# Patient Record
Sex: Female | Born: 1935 | Race: White | Hispanic: No | Smoking: Former smoker
Health system: Southern US, Community
[De-identification: ages and names within clinical notes are randomized; demographics above are authoritative.]

## PROBLEM LIST (undated history)

## (undated) DIAGNOSIS — C50919 Malignant neoplasm of unspecified site of unspecified female breast: Secondary | ICD-10-CM

## (undated) DIAGNOSIS — J449 Chronic obstructive pulmonary disease, unspecified: Secondary | ICD-10-CM

## (undated) DIAGNOSIS — I739 Peripheral vascular disease, unspecified: Secondary | ICD-10-CM

## (undated) HISTORY — PX: APPENDECTOMY: SHX54

---

## 2009-10-14 ENCOUNTER — Encounter: Admission: RE | Admit: 2009-10-14 | Discharge: 2009-10-14 | Payer: Self-pay | Admitting: Neurological Surgery

## 2009-10-19 ENCOUNTER — Encounter: Admission: RE | Admit: 2009-10-19 | Discharge: 2009-10-19 | Payer: Self-pay | Admitting: Neurological Surgery

## 2009-11-06 ENCOUNTER — Ambulatory Visit (HOSPITAL_COMMUNITY): Admission: RE | Admit: 2009-11-06 | Discharge: 2009-11-06 | Payer: Self-pay | Admitting: Neurological Surgery

## 2009-11-20 ENCOUNTER — Inpatient Hospital Stay (HOSPITAL_COMMUNITY)
Admission: RE | Admit: 2009-11-20 | Discharge: 2009-11-21 | Payer: Self-pay | Source: Home / Self Care | Admitting: Neurological Surgery

## 2009-12-28 ENCOUNTER — Encounter: Admission: RE | Admit: 2009-12-28 | Discharge: 2009-12-28 | Payer: Self-pay | Admitting: Neurological Surgery

## 2010-03-08 ENCOUNTER — Encounter
Admission: RE | Admit: 2010-03-08 | Discharge: 2010-03-08 | Payer: Self-pay | Source: Home / Self Care | Attending: Neurological Surgery | Admitting: Neurological Surgery

## 2010-04-14 ENCOUNTER — Inpatient Hospital Stay: Admission: RE | Admit: 2010-04-14 | Payer: Self-pay | Source: Home / Self Care | Admitting: Neurological Surgery

## 2010-05-06 LAB — BASIC METABOLIC PANEL
CO2: 27 mEq/L (ref 19–32)
Chloride: 115 mEq/L — ABNORMAL HIGH (ref 96–112)
GFR calc Af Amer: >60 mL/min (ref >60)
Potassium: 4.8 mEq/L (ref 3.5–5.1)

## 2010-05-06 LAB — DIFFERENTIAL
Basophils Relative: 0 % (ref 0–1)
Eosinophils Absolute: 0.2 10*3/uL (ref 0.0–0.7)
Eosinophils Relative: 2 % (ref 0–5)
Lymphs Abs: 1.2 10*3/uL (ref 0.7–4.0)
Monocytes Relative: 11 % (ref 3–12)
Neutrophils Relative %: 72 % (ref 43–77)

## 2010-05-06 LAB — CBC
HCT: 38.3 % (ref 36.0–46.0)
HCT: 38.5 % (ref 36.0–46.0)
Hemoglobin: 12.2 g/dL (ref 12.0–15.0)
MCH: 33.2 pg (ref 26.0–34.0)
MCHC: 31.7 g/dL (ref 30.0–36.0)
MCV: 101.6 fL — ABNORMAL HIGH (ref 78.0–100.0)
RBC: 3.77 MIL/uL — ABNORMAL LOW (ref 3.87–5.11)
RBC: 3.9 MIL/uL (ref 3.87–5.11)
WBC: 5.3 10*3/uL (ref 4.0–10.5)
WBC: 7.9 10*3/uL (ref 4.0–10.5)

## 2010-05-06 LAB — COMPREHENSIVE METABOLIC PANEL
ALT: 21 U/L (ref 0–35)
AST: 27 U/L (ref 0–37)
Alkaline Phosphatase: 89 U/L (ref 39–117)
CO2: 26 mEq/L (ref 19–32)
Calcium: 9.6 mg/dL (ref 8.4–10.5)
Chloride: 109 mEq/L (ref 96–112)
GFR calc Af Amer: >60 mL/min (ref >60)
GFR calc non Af Amer: >60 mL/min (ref >60)
Glucose, Bld: 84 mg/dL (ref 70–99)
Potassium: 5.5 mEq/L — ABNORMAL HIGH (ref 3.5–5.1)
Sodium: 140 mEq/L (ref 135–145)

## 2010-05-06 LAB — SURGICAL PCR SCREEN: MRSA, PCR: NEGATIVE

## 2010-05-06 LAB — PROTIME-INR: Prothrombin Time: 13.3 seconds (ref 11.6–15.2)

## 2010-05-06 LAB — APTT: aPTT: 30 seconds (ref 24–37)

## 2010-05-24 ENCOUNTER — Encounter (HOSPITAL_COMMUNITY)
Admission: RE | Admit: 2010-05-24 | Discharge: 2010-05-24 | Disposition: A | Discharge status: Home / Self Care | Payer: Medicare Other | Source: Ambulatory Visit | Attending: Neurological Surgery | Admitting: Neurological Surgery

## 2010-05-24 LAB — TYPE AND SCREEN
ABO/RH(D): O POS
Antibody Screen: NEGATIVE

## 2010-05-24 LAB — DIFFERENTIAL
Lymphocytes Relative: 25 % (ref 12–46)
Lymphs Abs: 1.2 10*3/uL (ref 0.7–4.0)
Neutro Abs: 3 10*3/uL (ref 1.7–7.7)
Neutrophils Relative %: 62 % (ref 43–77)

## 2010-05-24 LAB — CBC
HCT: 39 % (ref 36.0–46.0)
Hemoglobin: 13 g/dL (ref 12.0–15.0)
MCV: 94.4 fL (ref 78.0–100.0)
Platelets: 308 10*3/uL (ref 150–400)
RBC: 4.13 MIL/uL (ref 3.87–5.11)
WBC: 4.8 10*3/uL (ref 4.0–10.5)

## 2010-05-24 LAB — APTT: aPTT: 29 seconds (ref 24–37)

## 2010-05-24 LAB — BASIC METABOLIC PANEL
Chloride: 107 mEq/L (ref 96–112)
Creatinine, Ser: 0.76 mg/dL (ref 0.4–1.2)
GFR calc Af Amer: >60 mL/min (ref >60)
Potassium: 4 mEq/L (ref 3.5–5.1)
Sodium: 142 mEq/L (ref 135–145)

## 2010-05-27 ENCOUNTER — Inpatient Hospital Stay (HOSPITAL_COMMUNITY)
Admission: RE | Admit: 2010-05-27 | Discharge: 2010-05-28 | DRG: 460 | Disposition: A | Discharge status: Home Health | Payer: Medicare Other | Source: Ambulatory Visit | Attending: Neurological Surgery | Admitting: Neurological Surgery

## 2010-05-27 ENCOUNTER — Inpatient Hospital Stay (HOSPITAL_COMMUNITY): Payer: Medicare Other

## 2010-05-27 DIAGNOSIS — Q762 Congenital spondylolisthesis: Secondary | ICD-10-CM

## 2010-05-27 DIAGNOSIS — Z880 Allergy status to penicillin: Secondary | ICD-10-CM

## 2010-05-27 DIAGNOSIS — J4489 Other specified chronic obstructive pulmonary disease: Secondary | ICD-10-CM | POA: Diagnosis present

## 2010-05-27 DIAGNOSIS — Z96649 Presence of unspecified artificial hip joint: Secondary | ICD-10-CM

## 2010-05-27 DIAGNOSIS — J449 Chronic obstructive pulmonary disease, unspecified: Secondary | ICD-10-CM | POA: Diagnosis present

## 2010-05-27 DIAGNOSIS — M48062 Spinal stenosis, lumbar region with neurogenic claudication: Principal | ICD-10-CM | POA: Diagnosis present

## 2010-05-27 DIAGNOSIS — Z01812 Encounter for preprocedural laboratory examination: Secondary | ICD-10-CM

## 2010-06-07 NOTE — Op Note (Signed)
NAMEBROOKSIE, ELLWANGER                ACCOUNT NO.:  [id number]  MEDICAL RECORD NO.:  [id number]           PATIENT TYPE:  I  LOCATION:  3533                         FACILITY:  [facility]  PHYSICIAN:  Tia Alert, MD     DATE OF BIRTH:  10/31/1935  DATE OF PROCEDURE:  05/27/2010 DATE OF DISCHARGE:                              OPERATIVE REPORT   PREOPERATIVE DIAGNOSES: 1. Severe lumbar spinal stenosis, L2-3, L3-4, and L4-5. 2. Spondylolisthesis, L4-5. 3. Facet arthropathy. 4. Leg pain.  POSTOPERATIVE DIAGNOSES: 1. Severe lumbar spinal stenosis, L2-3, L3-4, and L4-5. 2. Spondylolisthesis, L4-5. 3. Facet arthropathy. 4. Leg pain.  PROCEDURES: 1. Decompressive lumbar laminectomy, medial facetectomy, and     foraminotomies at L2-3, L3-4, and L4-5 for central canal and nerve     root decompression. 2. Posterior interlaminar arthrodesis at L4-5 on the right utilizing     local autograft and Actifuse putty.  SURGEON:  Tia Alert, MD  ASSISTANT:  Reinaldo Meeker, MD  ANESTHESIA:  General endotracheal.  COMPLICATIONS:  None apparent.  INDICATIONS FOR PROCEDURE:  Ms. Michele Harper is a very pleasant 75 year old female who presented with neurogenic claudication and cramping in her legs with some weakness.  She progressed and she needed a walker for stability.  She had a CT myelogram which showed severe spinal stenosis at L2-3, L3-4, and L4-5.  There was complete myelographic block at L3-4. She had a spondylolisthesis at L4-5.  I recommended decompression and fusion.  She understood the risks, benefits, and expected outcome and wished to proceed.  DESCRIPTION OF THE PROCEDURE:  The patient was taken to the operating room, and after induction of adequate generalized endotracheal anesthesia, she was rolled into prone position on the Wilson frame.  All pressure points were padded.  Her lumbar region was prepped with DuraPrep and then draped in usual sterile fashion.  A 10 mL of  local anesthesia was injected and a dorsal midline incision was made and carried down to the lumbosacral fascia.  The fascia was opened and the paraspinous musculature was taken down in subperiosteal fashion to expose L2-3, L3-4, and L4-5.  Intraoperative fluoroscopy confirmed my level and then I was able to remove the spinous process of L3 and the bottom part of L2.  At L4-5, I was going to try to put a spinous process plate on but her bone was fairly fragile and spinous process was fairly loose and I did not feel like it would add any stability, but because I wanted to a posterior arthrodesis, I did a left hemilaminectomy, medial facetectomy, and foraminotomy at L4-5.  The underlying yellow ligament was opened and removed to expose the underlying L5 nerve root.  I dissected it along the medial pedicle wall and distal to the pedicle.  I then drilled up under the spinous process and performed a sublaminar decompression to decompress the central canal and the right lateral recess until the pedicle was identified and palpated.  I felt like I had good central decompression here.  I then went to L3 and was able to perform a complete laminectomy of L3 undercutting lateral recess  and removing the yellow ligament.  There was significant overgrowth of the yellow ligament on both sides causing severe stenosis.  Once this was removed, the dura was full and free, dissected again along the pedicle wall of L3-L4 the along the pedicle of L3 and then removed the inferior part of the lamina of L2 and undercut L2-3 to remove all the yellow ligament at L2-3 to decompress the canal.  Once I was done, I could palpate easily along the nerve roots.  The lateral recess was well decompressed, I felt like I had excellent decompression at L2-3, L3-4, and L4-5.  I then decorticated the lamina of L4-5, irrigated with saline solution and bacitracin, lined the decorticated segment with local autograft and Actifuse  putty to perform arthrodesis at L4-5 and then lined the dura with Gelfoam, placed a medium Hemovac drain through a separate stab incision and then closed the muscle and fascia with 0 Vicryl, closed the subcutaneous and subcuticular tissue with 2-0 and 3-0 Vicryl, and closed the skin with Benzoin and Steri-Strips.  The drapes were removed and sterile dressing was applied.  The patient was awakened from general anesthesia and transferred to recovery room in stable condition.  At the end of the procedure, all sponge, needle, and instrument counts were correct.     Tia Alert, MD     DSJ/MEDQ  D:  05/27/2010  T:  05/28/2010  Job:  [id number]  Electronically Signed by Marikay Alar MD on 06/06/2010 12:58:14 PM

## 2011-04-28 IMAGING — CR DG CHEST 2V
2 series · 2 of 2 positions shown · non-contrast
Comparison: None

CLINICAL DATA: Cervical spondylosis and degenerative disease.

CHEST - 2 VIEW

[view not recorded (1 of 2)]
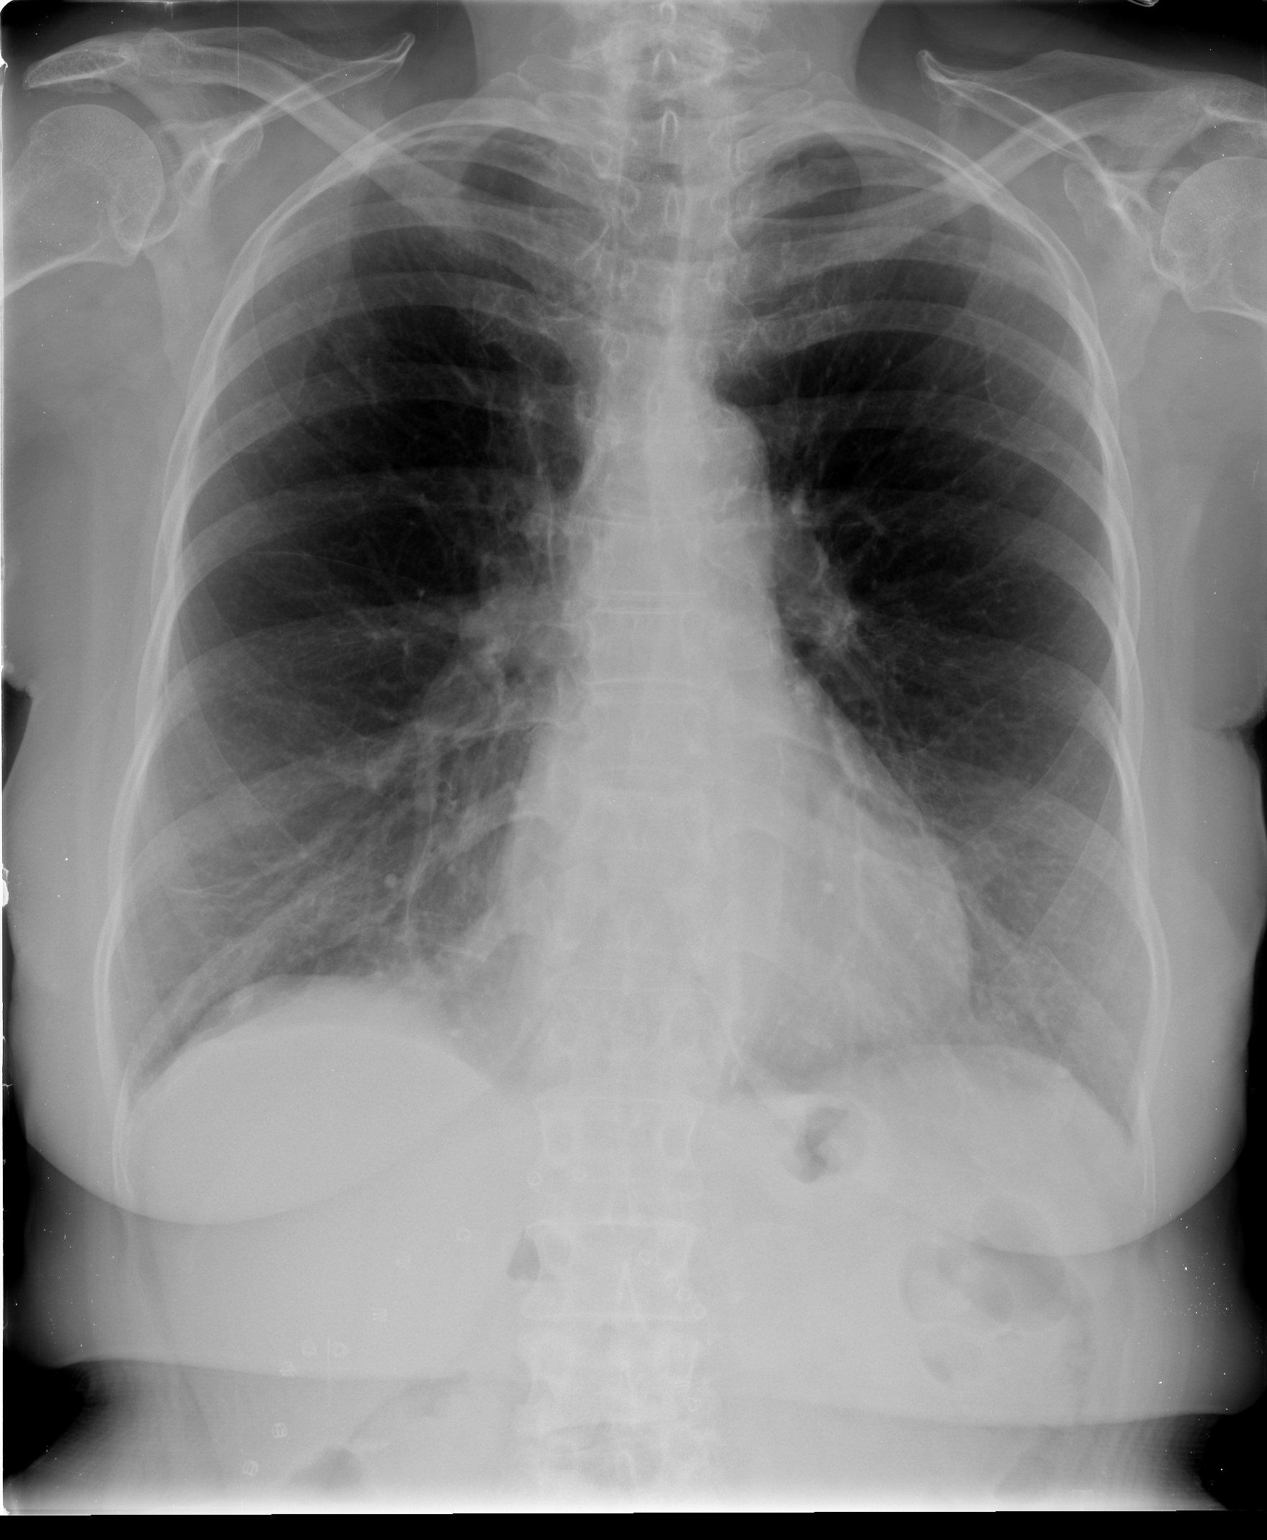

[view not recorded (2 of 2)]
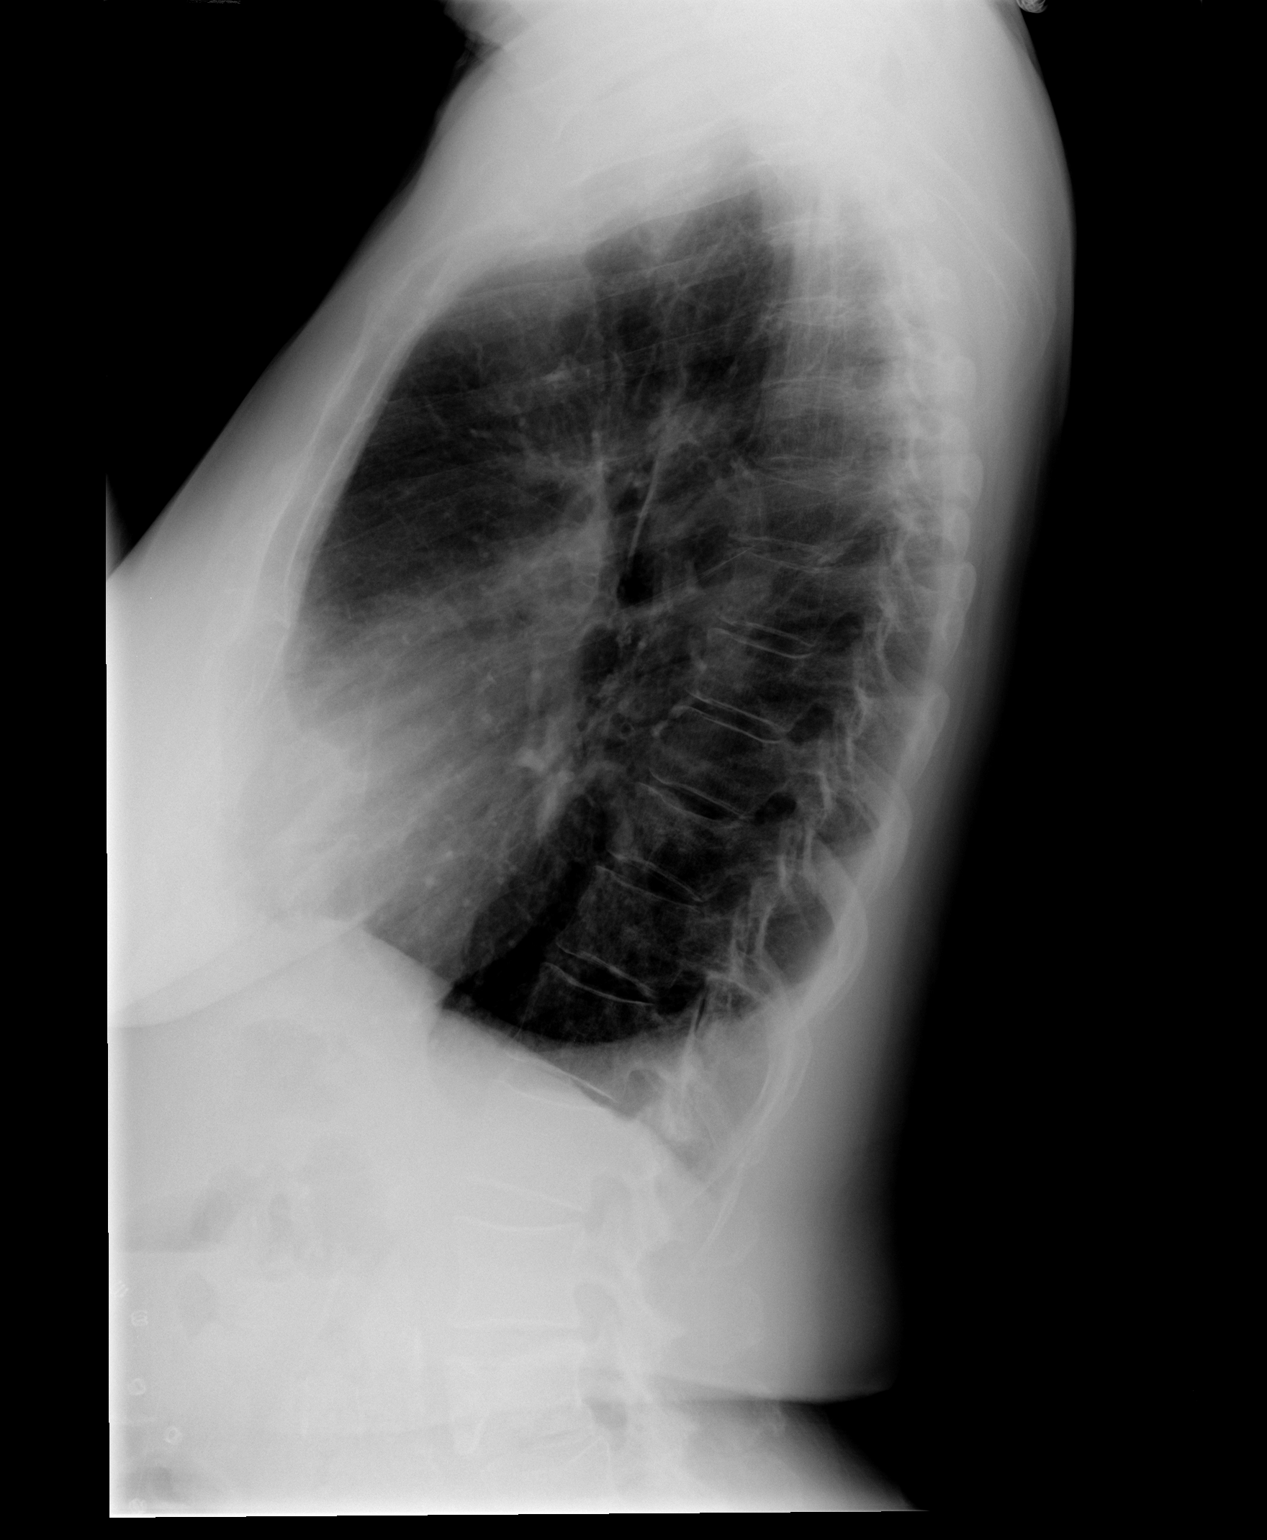

[2 of 2 positions shown; findings below may reference images not displayed]

FINDINGS: The cardiac silhouette, mediastinal and hilar contours
are within normal limits for age.  There are chronic-appearing
bronchitic type lung changes and COPD.  Streaky basilar atelectasis
or scarring but no infiltrates, edema or effusions.  The bony
thorax is intact.
IMPRESSION: Chronic-appearing lung changes/COPD.  No acute pulmonary findings.

## 2011-08-28 IMAGING — CR DG CERVICAL SPINE 2 OR 3 VIEWS
2 series · 2 of 2 positions shown · non-contrast
Comparison: 12/28/2009.

CLINICAL DATA: Evaluate posterior fusion.  Neck pain.

CERVICAL SPINE - 2-3 VIEW

[w c-spine lat]
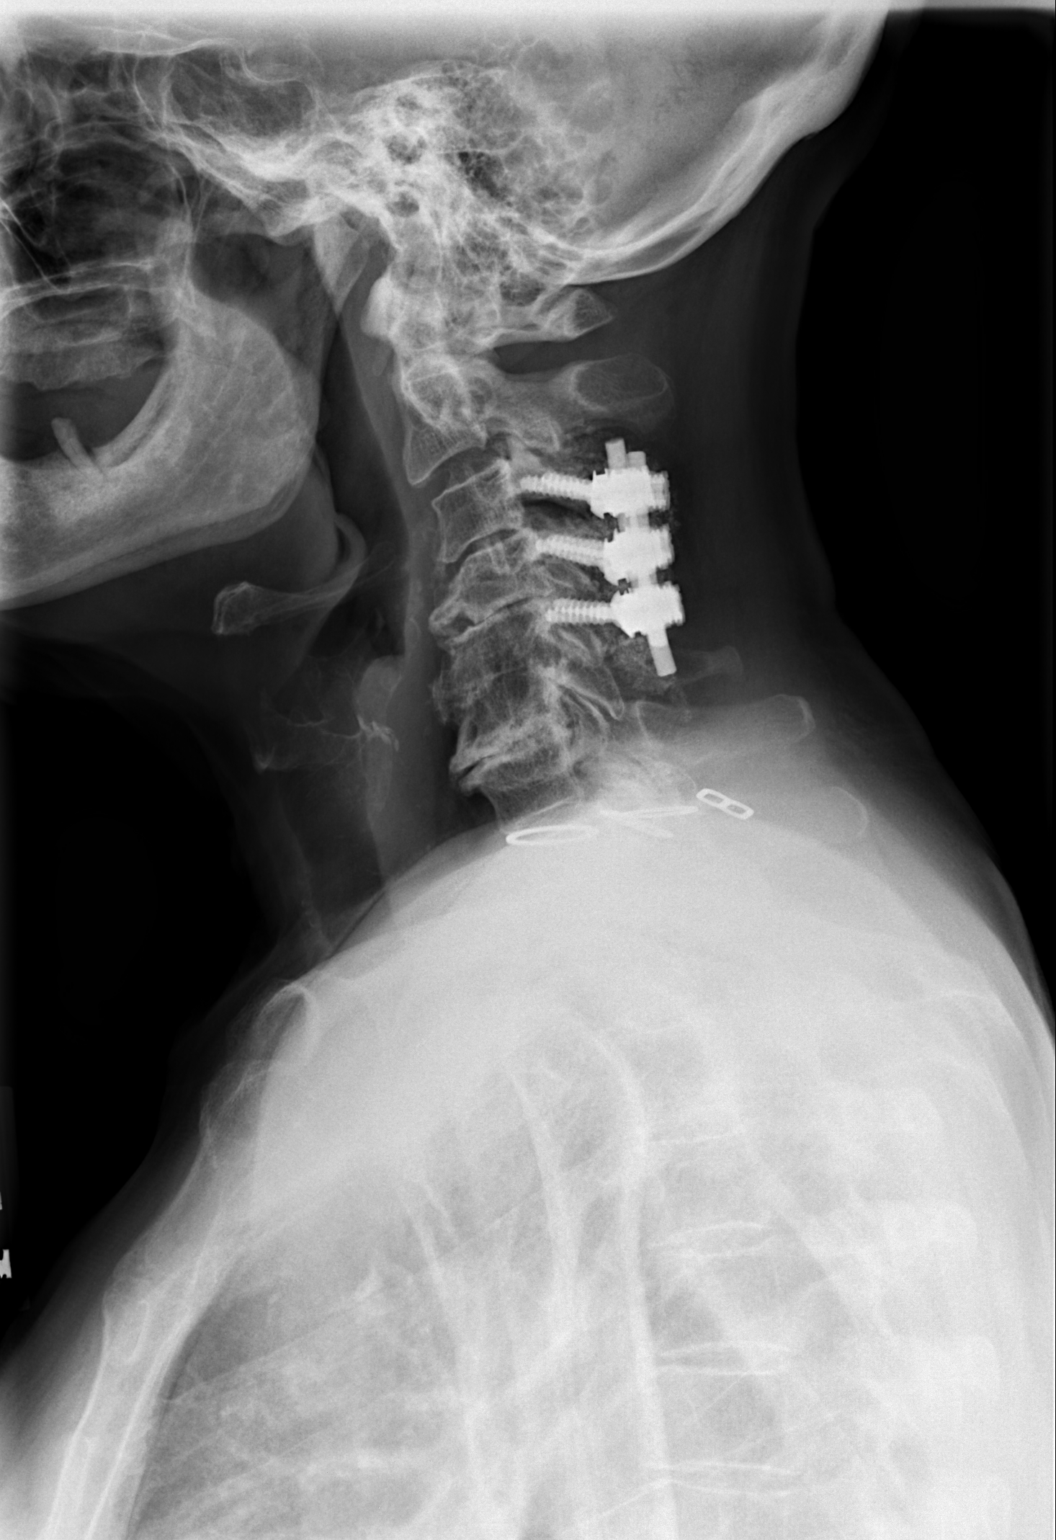

[w c-spine a.p. *]
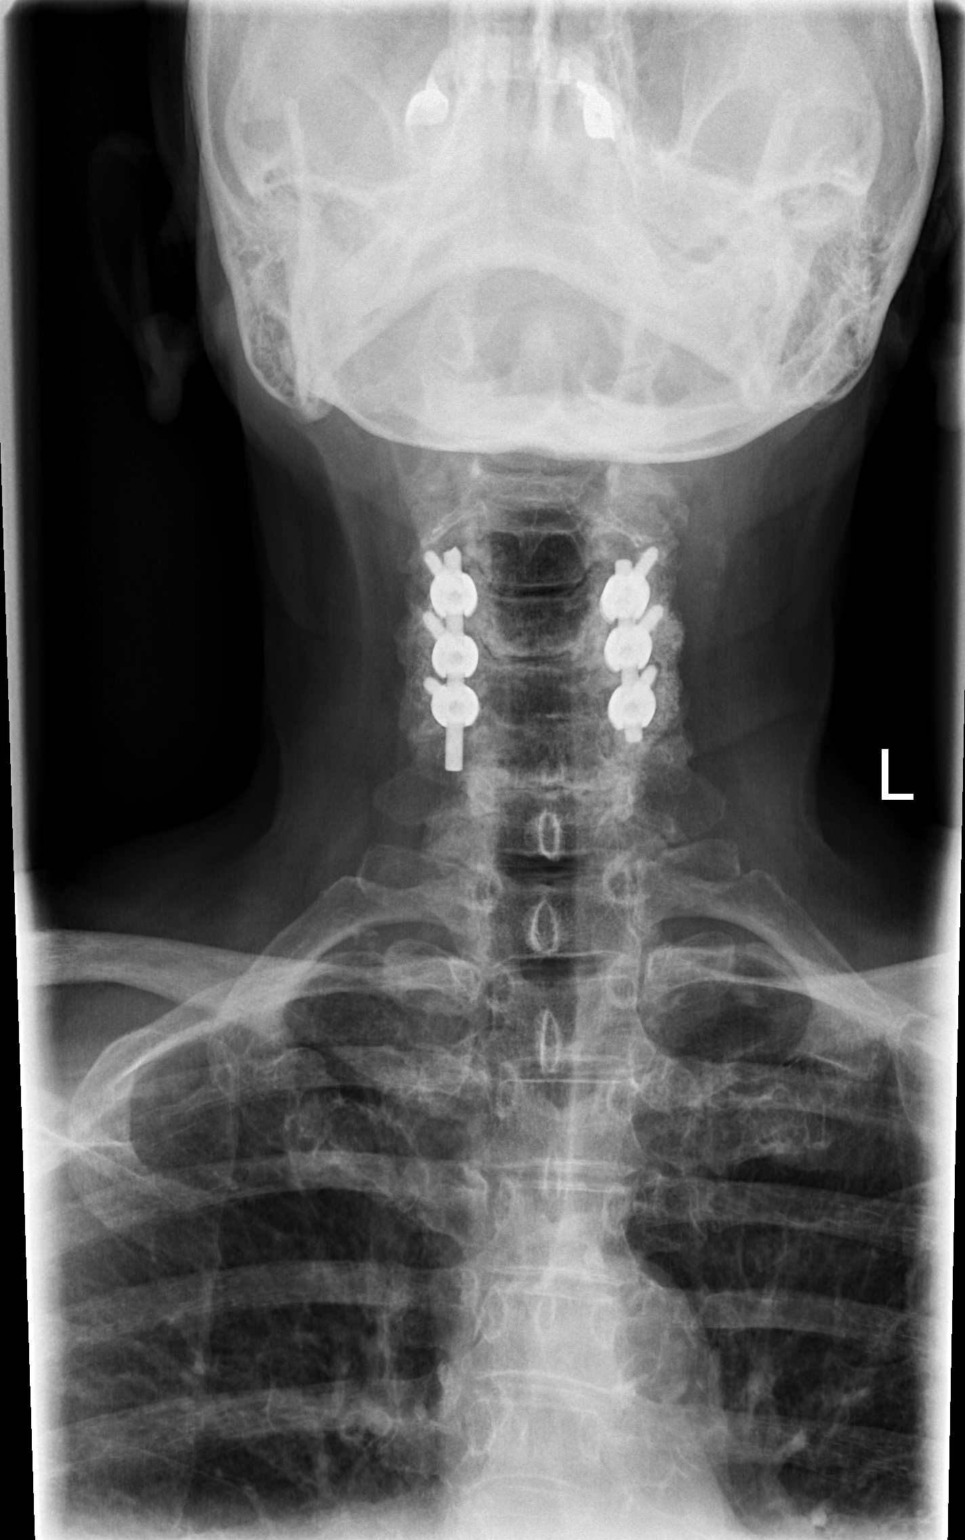

[2 of 2 positions shown; findings below may reference images not displayed]

FINDINGS: Transfacet screw and rod fixation bilaterally C3-C5 as
noted previously.  Loosening of the left C5 screw is suspected, and
may represent interval change from 12/28/2009.  Advanced disc space
narrowing C5-6 with functional fusion at this level.  Severe disc
space narrowing C6-7 likely unfused.  There is no interbody fusion
at C3-4 or C4-5.  There is mild facet mediated slip at C2-C3.
IMPRESSION: Suspect lucency surrounding the left C5 screw suggesting incomplete
fusion.  CT scan of the cervical spine without contrast may be
helpful in further evaluation.

## 2012-07-12 DIAGNOSIS — D649 Anemia, unspecified: Secondary | ICD-10-CM | POA: Insufficient documentation

## 2015-04-09 DIAGNOSIS — J449 Chronic obstructive pulmonary disease, unspecified: Secondary | ICD-10-CM | POA: Diagnosis present

## 2017-03-29 DIAGNOSIS — G40909 Epilepsy, unspecified, not intractable, without status epilepticus: Secondary | ICD-10-CM

## 2021-09-27 ENCOUNTER — Inpatient Hospital Stay (HOSPITAL_COMMUNITY)
Admission: EM | Admit: 2021-09-27 | Discharge: 2021-10-04 | DRG: 689 | Disposition: A | Discharge status: Home Health | Payer: Medicare HMO | Attending: Internal Medicine | Admitting: Internal Medicine

## 2021-09-27 ENCOUNTER — Other Ambulatory Visit: Payer: Self-pay

## 2021-09-27 ENCOUNTER — Encounter (HOSPITAL_COMMUNITY): Payer: Self-pay

## 2021-09-27 ENCOUNTER — Emergency Department (HOSPITAL_COMMUNITY): Payer: Medicare HMO

## 2021-09-27 DIAGNOSIS — Z87891 Personal history of nicotine dependence: Secondary | ICD-10-CM

## 2021-09-27 DIAGNOSIS — R4182 Altered mental status, unspecified: Secondary | ICD-10-CM

## 2021-09-27 DIAGNOSIS — U071 COVID-19: Secondary | ICD-10-CM | POA: Diagnosis not present

## 2021-09-27 DIAGNOSIS — Z880 Allergy status to penicillin: Secondary | ICD-10-CM

## 2021-09-27 DIAGNOSIS — B964 Proteus (mirabilis) (morganii) as the cause of diseases classified elsewhere: Secondary | ICD-10-CM | POA: Diagnosis present

## 2021-09-27 DIAGNOSIS — I739 Peripheral vascular disease, unspecified: Secondary | ICD-10-CM | POA: Diagnosis present

## 2021-09-27 DIAGNOSIS — R197 Diarrhea, unspecified: Secondary | ICD-10-CM

## 2021-09-27 DIAGNOSIS — Z79899 Other long term (current) drug therapy: Secondary | ICD-10-CM

## 2021-09-27 DIAGNOSIS — N179 Acute kidney failure, unspecified: Secondary | ICD-10-CM | POA: Diagnosis present

## 2021-09-27 DIAGNOSIS — Z6822 Body mass index (BMI) 22.0-22.9, adult: Secondary | ICD-10-CM

## 2021-09-27 DIAGNOSIS — J449 Chronic obstructive pulmonary disease, unspecified: Secondary | ICD-10-CM | POA: Diagnosis present

## 2021-09-27 DIAGNOSIS — J9811 Atelectasis: Secondary | ICD-10-CM | POA: Diagnosis present

## 2021-09-27 DIAGNOSIS — Z853 Personal history of malignant neoplasm of breast: Secondary | ICD-10-CM

## 2021-09-27 DIAGNOSIS — G40909 Epilepsy, unspecified, not intractable, without status epilepticus: Secondary | ICD-10-CM

## 2021-09-27 DIAGNOSIS — E876 Hypokalemia: Secondary | ICD-10-CM | POA: Diagnosis present

## 2021-09-27 DIAGNOSIS — N3000 Acute cystitis without hematuria: Secondary | ICD-10-CM | POA: Diagnosis not present

## 2021-09-27 DIAGNOSIS — K5730 Diverticulosis of large intestine without perforation or abscess without bleeding: Secondary | ICD-10-CM | POA: Diagnosis present

## 2021-09-27 DIAGNOSIS — G9341 Metabolic encephalopathy: Secondary | ICD-10-CM

## 2021-09-27 DIAGNOSIS — J9611 Chronic respiratory failure with hypoxia: Secondary | ICD-10-CM

## 2021-09-27 DIAGNOSIS — Z91199 Patient's noncompliance with other medical treatment and regimen due to unspecified reason: Secondary | ICD-10-CM

## 2021-09-27 DIAGNOSIS — Z9981 Dependence on supplemental oxygen: Secondary | ICD-10-CM

## 2021-09-27 DIAGNOSIS — R634 Abnormal weight loss: Secondary | ICD-10-CM | POA: Diagnosis present

## 2021-09-27 DIAGNOSIS — Z888 Allergy status to other drugs, medicaments and biological substances status: Secondary | ICD-10-CM

## 2021-09-27 DIAGNOSIS — E860 Dehydration: Secondary | ICD-10-CM

## 2021-09-27 DIAGNOSIS — K529 Noninfective gastroenteritis and colitis, unspecified: Secondary | ICD-10-CM

## 2021-09-27 HISTORY — DX: Malignant neoplasm of unspecified site of unspecified female breast: C50.919

## 2021-09-27 HISTORY — DX: Peripheral vascular disease, unspecified: I73.9

## 2021-09-27 HISTORY — DX: Chronic obstructive pulmonary disease, unspecified: J44.9

## 2021-09-27 LAB — COMPREHENSIVE METABOLIC PANEL
ALT: 13 U/L (ref 0–44)
AST: 22 U/L (ref 15–41)
Albumin: 3.9 g/dL (ref 3.5–5.0)
Alkaline Phosphatase: 107 U/L (ref 38–126)
Anion gap: 9 (ref 5–15)
BUN: 14 mg/dL (ref 8–23)
CO2: 20 mmol/L — ABNORMAL LOW (ref 22–32)
Calcium: 9.1 mg/dL (ref 8.9–10.3)
Chloride: 111 mmol/L (ref 98–111)
Creatinine, Ser: 1.06 mg/dL — ABNORMAL HIGH (ref 0.44–1.00)
GFR, Estimated: 52 mL/min — ABNORMAL LOW (ref >60)
Glucose, Bld: 91 mg/dL (ref 70–99)
Potassium: 4.4 mmol/L (ref 3.5–5.1)
Sodium: 140 mmol/L (ref 135–145)
Total Bilirubin: 0.3 mg/dL (ref 0.3–1.2)
Total Protein: 6.9 g/dL (ref 6.5–8.1)

## 2021-09-27 LAB — CBC WITH DIFFERENTIAL/PLATELET
Abs Immature Granulocytes: 0.03 10*3/uL (ref 0.00–0.07)
Basophils Absolute: 0 10*3/uL (ref 0.0–0.1)
Basophils Relative: 0 %
Eosinophils Absolute: 0 10*3/uL (ref 0.0–0.5)
Eosinophils Relative: 1 %
HCT: 42.1 % (ref 36.0–46.0)
Hemoglobin: 13.5 g/dL (ref 12.0–15.0)
Immature Granulocytes: 0 %
Lymphocytes Relative: 7 %
Lymphs Abs: 0.5 10*3/uL — ABNORMAL LOW (ref 0.7–4.0)
MCH: 32.5 pg (ref 26.0–34.0)
MCHC: 32.1 g/dL (ref 30.0–36.0)
MCV: 101.4 fL — ABNORMAL HIGH (ref 80.0–100.0)
Monocytes Absolute: 0.8 10*3/uL (ref 0.1–1.0)
Monocytes Relative: 11 %
Neutro Abs: 5.8 10*3/uL (ref 1.7–7.7)
Neutrophils Relative %: 81 %
Platelets: 187 10*3/uL (ref 150–400)
RBC: 4.15 MIL/uL (ref 3.87–5.11)
RDW: 16 % — ABNORMAL HIGH (ref 11.5–15.5)
WBC: 7.2 10*3/uL (ref 4.0–10.5)
nRBC: 0 % (ref 0.0–0.2)

## 2021-09-27 LAB — LIPASE, BLOOD: Lipase: 29 U/L (ref 11–51)

## 2021-09-27 MED ORDER — LACTATED RINGERS IV BOLUS
1000.0000 mL | Freq: Once | INTRAVENOUS | Status: AC
  Administered 2021-09-28: 1000 mL via INTRAVENOUS

## 2021-09-27 NOTE — ED Provider Notes (Signed)
  [facility] DEPT Provider Note   CSN: [id number] Arrival date & time: 09/27/21  2214     History {Add pertinent medical, surgical, social history, OB history to HPI:1} Chief Complaint  Patient presents with   Emesis   Nausea    Michele Harper is a 86 y.o. female.  86 yo F with hx of COPD on 2L [place] (noncompliant), peripheral vascular disease, and breast cancer in remission. Per daughter Michele Harper. 4-5 weeks of diarrhea on immodium TID. Diarrhea getting worse. Now with decreased PO intake. Also very nauseous but unsure of vomiting. Drinking coca cola mostly recently because it settles her stomach. Increased confusion that started yesterday. Has lost 8 lbs in the past week. Also with stomach distention recently. Was referred to GI but has been unable to make the appointment. Does have hx of c diff. They were unable to get stool sample without urine for hers. Does still take protonix. Was on abx for UTI >1 mo ago no other recent abx.    Emesis  Past Medical History:  Diagnosis Date   Breast cancer ([place])    COPD (chronic obstructive pulmonary disease) (HCC)    2L but noncompliant   Peripheral vascular disease (HCC)        Home Medications Prior to Admission medications   Not on File      Allergies    Ertapenem, Penicillins, Procaine, and Alendronate    Review of Systems   Review of Systems  Gastrointestinal:  Positive for vomiting.    Physical Exam Updated Vital Signs BP (!) 141/96 (BP Location: Right Arm)   Pulse 94   Temp 98.2 F (36.8 C) (Oral)   Resp 20   LMP  (LMP Unknown)   SpO2 100%  Physical Exam  ED Results / Procedures / Treatments   Labs (all labs ordered are listed, but only abnormal results are displayed) Labs Reviewed  URINE CULTURE  CBC WITH DIFFERENTIAL/PLATELET  COMPREHENSIVE METABOLIC PANEL  LIPASE, BLOOD  URINALYSIS, ROUTINE W REFLEX MICROSCOPIC    EKG None  Radiology No results  found.  Procedures Procedures  {Document cardiac monitor, telemetry assessment procedure when appropriate:1}  Medications Ordered in ED Medications - No data to display  ED Course/ Medical Decision Making/ A&P                           Medical Decision Making Amount and/or Complexity of Data Reviewed Radiology: ordered.   ***  {Document critical care time when appropriate:1} {Document review of labs and clinical decision tools ie heart score, Chads2Vasc2 etc:1}  {Document your independent review of radiology images, and any outside records:1} {Document your discussion with family members, caretakers, and with consultants:1} {Document social determinants of health affecting pt's care:1} {Document your decision making why or why not admission, treatments were needed:1} Final Clinical Impression(s) / ED Diagnoses Final diagnoses:  None    Rx / DC Orders ED Discharge Orders     None

## 2021-09-27 NOTE — ED Triage Notes (Signed)
Arrives via [facility] from home for N/V x1 week. Denies abdominal pain.

## 2021-09-28 ENCOUNTER — Emergency Department (HOSPITAL_COMMUNITY): Payer: Medicare HMO

## 2021-09-28 ENCOUNTER — Encounter (HOSPITAL_COMMUNITY): Payer: Self-pay | Admitting: Internal Medicine

## 2021-09-28 DIAGNOSIS — J449 Chronic obstructive pulmonary disease, unspecified: Secondary | ICD-10-CM

## 2021-09-28 DIAGNOSIS — K529 Noninfective gastroenteritis and colitis, unspecified: Secondary | ICD-10-CM | POA: Diagnosis not present

## 2021-09-28 DIAGNOSIS — N3000 Acute cystitis without hematuria: Secondary | ICD-10-CM | POA: Diagnosis not present

## 2021-09-28 DIAGNOSIS — J9611 Chronic respiratory failure with hypoxia: Secondary | ICD-10-CM

## 2021-09-28 DIAGNOSIS — E860 Dehydration: Secondary | ICD-10-CM | POA: Diagnosis not present

## 2021-09-28 DIAGNOSIS — G9341 Metabolic encephalopathy: Secondary | ICD-10-CM

## 2021-09-28 LAB — URINALYSIS, ROUTINE W REFLEX MICROSCOPIC
Bilirubin Urine: NEGATIVE
Glucose, UA: NEGATIVE mg/dL
Hgb urine dipstick: NEGATIVE
Ketones, ur: NEGATIVE mg/dL
Nitrite: POSITIVE — AB
Protein, ur: NEGATIVE mg/dL
Specific Gravity, Urine: 1.017 (ref 1.005–1.030)
WBC, UA: >50 WBC/hpf — ABNORMAL HIGH (ref 0–5)
pH: 7 (ref 5.0–8.0)

## 2021-09-28 LAB — C DIFFICILE QUICK SCREEN W PCR REFLEX
C Diff antigen: NEGATIVE
C Diff interpretation: NOT DETECTED
C Diff toxin: NEGATIVE

## 2021-09-28 MED ORDER — IOHEXOL 300 MG/ML  SOLN
100.0000 mL | Freq: Once | INTRAMUSCULAR | Status: AC | PRN
  Administered 2021-09-28: 80 mL via INTRAVENOUS

## 2021-09-28 MED ORDER — SODIUM CHLORIDE 0.9 % IV SOLN
1.0000 g | INTRAVENOUS | Status: DC
  Administered 2021-09-29 – 2021-10-01 (×3): 1 g via INTRAVENOUS
  Filled 2021-09-28 (×3): qty 10

## 2021-09-28 MED ORDER — SODIUM CHLORIDE (PF) 0.9 % IJ SOLN
INTRAMUSCULAR | Status: AC
  Filled 2021-09-28: qty 50

## 2021-09-28 MED ORDER — OXYBUTYNIN CHLORIDE 5 MG PO TABS
5.0000 mg | ORAL_TABLET | Freq: Three times a day (TID) | ORAL | Status: DC
  Administered 2021-09-28 – 2021-10-04 (×19): 5 mg via ORAL
  Filled 2021-09-28 (×19): qty 1

## 2021-09-28 MED ORDER — SODIUM CHLORIDE 0.9 % IV SOLN
1.0000 g | INTRAVENOUS | Status: AC
  Administered 2021-09-28: 1 g via INTRAVENOUS
  Filled 2021-09-28: qty 10

## 2021-09-28 MED ORDER — ACETAMINOPHEN 325 MG PO TABS
650.0000 mg | ORAL_TABLET | Freq: Four times a day (QID) | ORAL | Status: DC | PRN
  Administered 2021-09-28 – 2021-10-03 (×7): 650 mg via ORAL
  Filled 2021-09-28 (×7): qty 2

## 2021-09-28 MED ORDER — SERTRALINE HCL 50 MG PO TABS
150.0000 mg | ORAL_TABLET | Freq: Every day | ORAL | Status: DC
  Administered 2021-09-28 – 2021-10-04 (×7): 150 mg via ORAL
  Filled 2021-09-28 (×7): qty 1

## 2021-09-28 MED ORDER — LACTATED RINGERS IV SOLN: INTRAVENOUS | Status: AC

## 2021-09-28 MED ORDER — HEPARIN SODIUM (PORCINE) 5000 UNIT/ML IJ SOLN
5000.0000 [IU] | Freq: Three times a day (TID) | INTRAMUSCULAR | Status: DC
Start: 2021-09-28 — End: 2021-10-04
  Administered 2021-09-28 – 2021-10-04 (×17): 5000 [IU] via SUBCUTANEOUS
  Filled 2021-09-28 (×17): qty 1

## 2021-09-28 MED ORDER — ALBUTEROL SULFATE (2.5 MG/3ML) 0.083% IN NEBU
2.5000 mg | INHALATION_SOLUTION | RESPIRATORY_TRACT | Status: DC | PRN
  Administered 2021-10-04: 2.5 mg via RESPIRATORY_TRACT
  Filled 2021-09-28: qty 3

## 2021-09-28 MED ORDER — LEVETIRACETAM 250 MG PO TABS
250.0000 mg | ORAL_TABLET | Freq: Two times a day (BID) | ORAL | Status: DC
  Administered 2021-09-28 – 2021-10-04 (×12): 250 mg via ORAL
  Filled 2021-09-28 (×14): qty 1

## 2021-09-28 MED ORDER — ONDANSETRON HCL 4 MG PO TABS: 4.0000 mg | ORAL_TABLET | Freq: Four times a day (QID) | ORAL | Status: DC | PRN

## 2021-09-28 MED ORDER — PRAVASTATIN SODIUM 20 MG PO TABS
40.0000 mg | ORAL_TABLET | Freq: Every day | ORAL | Status: DC
  Administered 2021-09-28 – 2021-10-03 (×6): 40 mg via ORAL
  Filled 2021-09-28 (×6): qty 2

## 2021-09-28 MED ORDER — ONDANSETRON HCL 4 MG/2ML IJ SOLN: 4.0000 mg | Freq: Four times a day (QID) | INTRAMUSCULAR | Status: DC | PRN

## 2021-09-28 MED ORDER — BUPROPION HCL ER (XL) 150 MG PO TB24
150.0000 mg | ORAL_TABLET | Freq: Every morning | ORAL | Status: DC
  Administered 2021-09-28 – 2021-10-04 (×7): 150 mg via ORAL
  Filled 2021-09-28 (×7): qty 1

## 2021-09-28 MED ORDER — ARIPIPRAZOLE 10 MG PO TABS
5.0000 mg | ORAL_TABLET | Freq: Every day | ORAL | Status: DC
  Administered 2021-09-28 – 2021-10-04 (×7): 5 mg via ORAL
  Filled 2021-09-28 (×6): qty 1

## 2021-09-28 MED ORDER — ACETAMINOPHEN 650 MG RE SUPP: 650.0000 mg | Freq: Four times a day (QID) | RECTAL | Status: DC | PRN

## 2021-09-28 NOTE — H&P (Addendum)
History and Physical    BRIGETTA BECKSTROM OAC:[id number] DOB: 1935-09-26 DOA: 09/27/2021  DOS: the patient was seen and examined on 09/27/2021  PCP: Wayland Salinas, MD   Patient coming from: Home  I have personally briefly reviewed patient's old medical records in [place]  CC: N/V, diarrhea, confusion HPI: 86 year old white female history of COPD, chronic hypoxic respite failure on 2 L of oxygen which she is not compliant on, prior history of breast cancer, history of small bowel obstruction, presents to the ER today with diarrhea for about 5 weeks, nausea vomiting for about a week, confusion for the last 24 to 48 hours.  Her daughter Jinny Blossom is at her bedside.  Patient has been eating very little over the last couple days due to poor appetite and diarrhea.  She has been having diarrhea for the last 4 to 5 weeks.  PCP has ordered a GI consult which she has not yet to see.  She has been losing weight about 8 pounds over last week.  No fevers per se but the patient was confused.  She is unable to walk.  She normally uses a walker.  She lives with a 24-hour caretaker.  Daughter lives down the street from her.  On arrival to the ER, temp 98.2 heart rate 94 blood pressure 141/96.  Labs showed a UA that was positive for nitrates positive for esterase positive bacteria.  Sodium 140, bicarb 20, BUN of 14, creatinine 1.06  White count 7.2, hemoglobin 13.5, platelets 187  CT head showed no acute intracranial abnormality.  CT abdomen pelvis demonstrated no acute intra-abdominal process.  Chest x-ray showed bibasilar atelectasis.  Due to UTI and patient's confusion, [facility] hospitalist contacted for admission.   ED Course: UA positive for leukocyte esterase and nitrates.  Mildly dehydrated.  CT head and CT abdomen pelvis are negative.  Review of Systems:  Review of Systems  Constitutional:  Positive for malaise/fatigue. Negative for chills and fever.  HENT: Negative.    Eyes:  Negative.   Respiratory: Negative.    Cardiovascular: Negative.   Gastrointestinal:  Positive for abdominal pain, diarrhea, nausea and vomiting.  Genitourinary: Negative.   Musculoskeletal: Negative.   Skin: Negative.   Neurological:  Positive for weakness.  Endo/Heme/Allergies: Negative.   Psychiatric/Behavioral: Negative.    All other systems reviewed and are negative.   Past Medical History:  Diagnosis Date   Breast cancer ([place])    COPD (chronic obstructive pulmonary disease) ([place])    2L but noncompliant   Peripheral vascular disease ([place])     Past Surgical History:  Procedure Laterality Date   APPENDECTOMY       reports that she has quit smoking. Her smoking use included cigarettes. She has never used smokeless tobacco. She reports that she does not currently use alcohol. She reports that she does not use drugs.  Allergies  Allergen Reactions   Ertapenem Other (See Comments)    Other reaction(s): Confusion She started getting confused on week 3 of treatment, resolved off the medication.     Penicillins Itching and Rash    But can take amoxicillin.      Procaine Rash    Childhood allergy    Alendronate Rash and Other (See Comments)    osteomylititis      History reviewed. No pertinent family history.  Prior to Admission medications   Medication Sig Start Date End Date Taking? Authorizing Provider  acetaminophen (TYLENOL) 500 MG tablet Take 500 mg by mouth 2 (  two) times daily.   Yes [provider]  ANTI-DIARRHEAL 2 MG tablet Take 1 tablet by mouth 4 (four) times daily as needed for diarrhea or loose stools. 09/20/21  Yes [provider]  ARIPiprazole (ABILIFY) 5 MG tablet Take 5 mg by mouth daily. 09/15/21  Yes [provider]  buPROPion (WELLBUTRIN XL) 150 MG 24 hr tablet Take 150 mg by mouth every morning. 09/15/21  Yes [provider]  levETIRAcetam (KEPPRA) 250 MG tablet Take 250 mg by mouth 2 (two) times daily. 09/15/21   Yes [provider]  lovastatin (MEVACOR) 40 MG tablet Take 40 mg by mouth at bedtime. 09/15/21  Yes [provider]  oxybutynin (DITROPAN) 5 MG tablet Take 5 mg by mouth 3 (three) times daily. 09/15/21  Yes [provider]  pantoprazole (PROTONIX) 40 MG tablet Take 40 mg by mouth daily. 09/15/21  Yes [provider]  potassium chloride SA (KLOR-CON M) 20 MEQ tablet Take 20 mEq by mouth 2 (two) times daily. 09/15/21  Yes [provider]  sertraline (ZOLOFT) 100 MG tablet Take 150 mg by mouth daily. 09/15/21  Yes [provider]  traZODone (DESYREL) 50 MG tablet Take 25 mg by mouth at bedtime. 09/15/21  Yes [provider]    Physical Exam: Vitals:   09/27/21 2300 09/28/21 0045 09/28/21 0215 09/28/21 0315  BP: 128/80 110/87 126/77 (!) 160/80  Pulse: 92 100 95 99  Resp: (!) '27 17 17 18  '$ Temp:   99 F (37.2 C)   TempSrc:   Oral   SpO2: 92% 93% 91% (!) 89%    Physical Exam Vitals and nursing note reviewed.  Constitutional:      General: She is not in acute distress.    Appearance: She is not toxic-appearing.     Comments: Elderly Caucasian female in no acute distress.  Chronically ill-appearing.  HENT:     Head: Normocephalic and atraumatic.     Nose: Nose normal.  Eyes:     General: No scleral icterus. Cardiovascular:     Rate and Rhythm: Normal rate and regular rhythm.  Pulmonary:     Effort: Pulmonary effort is normal.     Breath sounds: Decreased breath sounds present. No wheezing.  Abdominal:     General: Bowel sounds are normal. There is no distension.     Tenderness: There is generalized abdominal tenderness. There is no guarding or rebound.     Comments: Mild general diffuse abdominal tenderness.  No rebound or guarding.  Musculoskeletal:     Right lower leg: No edema.     Left lower leg: No edema.  Skin:    Capillary Refill: Capillary refill takes less than 2 seconds.  Neurological:     General: No focal  deficit present.     Mental Status: She is alert and oriented to person, place, and time.      Labs on Admission: I have personally reviewed following labs and imaging studies  CBC: Recent Labs  Lab 09/27/21 2323  WBC 7.2  NEUTROABS 5.8  HGB 13.5  HCT 42.1  MCV 101.4*  PLT 811   Basic Metabolic Panel: Recent Labs  Lab 09/27/21 2323  NA 140  K 4.4  CL 111  CO2 20*  GLUCOSE 91  BUN 14  CREATININE 1.06*  CALCIUM 9.1   GFR: CrCl cannot be calculated (Unknown ideal weight.). Liver Function Tests: Recent Labs  Lab 09/27/21 2323  AST 22  ALT 13  ALKPHOS 107  BILITOT 0.3  PROT 6.9  ALBUMIN 3.9   Recent Labs  Lab 09/27/21 2323  LIPASE 29   No results for input(s): "AMMONIA" in the last 168 hours. Coagulation Profile: No results for input(s): "INR", "PROTIME" in the last 168 hours. Cardiac Enzymes: No results for input(s): "CKTOTAL", "CKMB", "CKMBINDEX", "TROPONINI", "TROPONINIHS" in the last 168 hours. BNP (last 3 results) No results for input(s): "PROBNP" in the last 8760 hours. HbA1C: No results for input(s): "HGBA1C" in the last 72 hours. CBG: No results for input(s): "GLUCAP" in the last 168 hours. Lipid Profile: No results for input(s): "CHOL", "HDL", "LDLCALC", "TRIG", "CHOLHDL", "LDLDIRECT" in the last 72 hours. Thyroid Function Tests: No results for input(s): "TSH", "T4TOTAL", "FREET4", "T3FREE", "THYROIDAB" in the last 72 hours. Anemia Panel: No results for input(s): "VITAMINB12", "FOLATE", "FERRITIN", "TIBC", "IRON", "RETICCTPCT" in the last 72 hours. Urine analysis:    Component Value Date/Time   COLORURINE YELLOW 09/27/2021 2348   APPEARANCEUR HAZY (A) 09/27/2021 2348   LABSPEC 1.017 09/27/2021 2348   PHURINE 7.0 09/27/2021 2348   GLUCOSEU NEGATIVE 09/27/2021 2348   HGBUR NEGATIVE 09/27/2021 2348   BILIRUBINUR NEGATIVE 09/27/2021 2348   KETONESUR NEGATIVE 09/27/2021 2348   PROTEINUR NEGATIVE 09/27/2021 2348   NITRITE POSITIVE (A)  09/27/2021 2348   LEUKOCYTESUR LARGE (A) 09/27/2021 2348    Radiological Exams on Admission: I have personally reviewed images CT Head Wo Contrast  Result Date: 09/28/2021 CLINICAL DATA:  Altered mental status EXAM: CT HEAD WITHOUT CONTRAST TECHNIQUE: Contiguous axial images were obtained from the base of the skull through the vertex without intravenous contrast. RADIATION DOSE REDUCTION: This exam was performed according to the departmental dose-optimization program which includes automated exposure control, adjustment of the mA and/or kV according to patient size and/or use of iterative reconstruction technique. COMPARISON:  None Available. FINDINGS: Brain: There is no mass, hemorrhage or extra-axial collection. There is generalized atrophy without lobar predilection. Hypodensity of the white matter is most commonly associated with chronic microvascular disease. Old right basal ganglia small vessel infarct. Vascular: Atherosclerotic calcification of the internal carotid arteries at the skull base. No abnormal hyperdensity of the major intracranial arteries or dural venous sinuses. Skull: The visualized skull base, calvarium and extracranial soft tissues are normal. Sinuses/Orbits: No fluid levels or advanced mucosal thickening of the visualized paranasal sinuses. No mastoid or middle ear effusion. The orbits are normal. IMPRESSION: 1. No acute intracranial abnormality. 2. Chronic microvascular ischemia and generalized atrophy. Electronically Signed   By: Ulyses Jarred M.D.   On: 09/28/2021 02:15   CT ABDOMEN PELVIS W CONTRAST  Result Date: 09/28/2021 CLINICAL DATA:  Nausea and vomiting EXAM: CT ABDOMEN AND PELVIS WITH CONTRAST TECHNIQUE: Multidetector CT imaging of the abdomen and pelvis was performed using the standard protocol following bolus administration of intravenous contrast. RADIATION DOSE REDUCTION: This exam was performed according to the departmental dose-optimization program which includes  automated exposure control, adjustment of the mA and/or kV according to patient size and/or use of iterative reconstruction technique. CONTRAST:  27m OMNIPAQUE IOHEXOL 300 MG/ML  SOLN COMPARISON:  None Available. FINDINGS: Lower Chest: Normal. Hepatobiliary: Normal hepatic contours. Mild biliary dilatation with common bile duct measuring 10 mm. There is mild intrahepatic dilatation as well. There is cholelithiasis without acute inflammation. Pancreas: Pancreas appears normal. Pancreatic duct is at upper limits of normal. Spleen: Normal. Adrenals/Urinary Tract: The adrenal glands are normal. No hydronephrosis, nephroureterolithiasis or solid renal mass. Left interpolar renal cyst measures 7.2 cm. This is Bosniak class 1 and  no follow-up imaging is recommended. The urinary bladder is normal for degree of distention Stomach/Bowel: There is no hiatal hernia. Normal duodenal course and caliber. No small bowel dilatation or inflammation. No focal colonic abnormality. Normal appendix. Vascular/Lymphatic: There is calcific atherosclerosis of the abdominal aorta. No lymphadenopathy. Reproductive: Status post hysterectomy. No adnexal mass. Other: Status post hernia repair. Musculoskeletal: Bilateral hip arthroplasties. Chronic compression fracture of L3 with pars interarticularis defects and grade 1 anterolisthesis. IMPRESSION: 1. No acute abnormality of the abdomen or pelvis. 2. Mild intrahepatic and extrahepatic biliary dilatation. 3. Chronic compression fracture of L3 with pars interarticularis defects and grade 1 anterolisthesis. 4. Aortic Atherosclerosis (ICD10-I70.0). Electronically Signed   By: Ulyses Jarred M.D.   On: 09/28/2021 02:08   DG Chest 1 View  Result Date: 09/28/2021 CLINICAL DATA:  Altered mental status.  Nausea and vomiting. EXAM: CHEST  1 VIEW COMPARISON:  Remote radiograph 11/06/2009 FINDINGS: The cardiomediastinal contours are normal. Diffuse thoracic aortic atherosclerosis. Mild bibasilar  atelectasis. Pulmonary vasculature is normal. No consolidation, pleural effusion, or pneumothorax. No acute osseous abnormalities are seen. Probable left calcified breast lesion IMPRESSION: 1. Mild bibasilar atelectasis. 2.  Aortic Atherosclerosis (ICD10-I70.0). Electronically Signed   By: Keith Rake M.D.   On: 09/28/2021 00:29    EKG: My personal interpretation of EKG shows: NSR  Assessment/Plan Principal Problem:   Acute cystitis without hematuria Active Problems:   Chronic diarrhea   Dehydration   Chronic obstructive pulmonary disease, unspecified (HCC)   Chronic respiratory failure with hypoxia (HCC) - noncompliant with 2 L/min   Acute metabolic encephalopathy   Seizure disorder (HCC)    Assessment and Plan: * Acute cystitis without hematuria Admit to observation medical bed.  Start IV Rocephin.  Await urine cultures.  Dehydration Baseline creatinine is about 1.7.  She is mildly hydrated.  Start gentle IV fluids.  Repeat BMP in the morning.  Chronic diarrhea Patient's had diarrhea for 5 weeks.  Lomotil has not been effective.  Patient has remote history of C. difficile.  She has been on antibiotics recently in the last 1 to 2 months for upper respiratory infection.  C. difficile panel and GI viral panel have been ordered.  Chronic respiratory failure with hypoxia (HCC) - noncompliant with 2 L/min Noncompliant with wearing oxygen at home.  Confirmed by the daughter.  Chronic obstructive pulmonary disease, unspecified (HCC) Chronic.  Not exacerbated.  Seizure disorder (HCC) Continue keppra 250 mg bid.  Acute metabolic encephalopathy Likely due to her UTI and dehydration.  Continue with IV fluids.  Hopefully this will clear up with symptomatic treatment.    DVT prophylaxis: SQ Heparin Code Status: Full Code Family Communication: discussed with pt and dtr Megan at bedside  Disposition Plan: return home  Consults called: EDP going to consult GI for diarrhea   Admission status: Observation, Med-Surg   Kristopher Oppenheim, DO [facility] 09/28/2021, 4:47 AM

## 2021-09-28 NOTE — Plan of Care (Signed)
Plan of care initiated and discussed. 

## 2021-09-28 NOTE — Assessment & Plan Note (Signed)
Chronic.  Not exacerbated.

## 2021-09-28 NOTE — Assessment & Plan Note (Addendum)
Continue keppra 250 mg bid.

## 2021-09-28 NOTE — ED Provider Notes (Signed)
  Physical Exam  BP 126/77   Pulse 95   Temp 99 F (37.2 C) (Oral)   Resp 17   LMP  (LMP Unknown)   SpO2 91%   Physical Exam Vitals and nursing note reviewed.  Constitutional:      General: She is not in acute distress.    Appearance: She is well-developed.  HENT:     Head: Normocephalic and atraumatic.  Eyes:     Conjunctiva/sclera: Conjunctivae normal.  Cardiovascular:     Rate and Rhythm: Normal rate and regular rhythm.     Heart sounds: No murmur heard. Pulmonary:     Effort: Pulmonary effort is normal. No respiratory distress.     Breath sounds: Normal breath sounds.  Abdominal:     Palpations: Abdomen is soft.     Tenderness: There is abdominal tenderness.  Musculoskeletal:        General: No swelling.     Cervical back: Neck supple.  Skin:    General: Skin is warm and dry.     Capillary Refill: Capillary refill takes less than 2 seconds.  Neurological:     Mental Status: She is alert.  Psychiatric:        Mood and Affect: Mood normal.     Procedures  Procedures  ED Course / MDM   Clinical Course as of 09/28/21 0314  Tue Sep 28, 2021  0045 Diarrhea, AMS, Uti today, pending CTs [MK]    Clinical Course User Index [MK] Huda Petrey, Debe Coder, MD   Medical Decision Making Amount and/or Complexity of Data Reviewed Radiology: ordered.  Risk Prescription drug management.   Patient received in handoff.  Persistent diarrhea, altered mental status with current UTI.  Review of previous culture data shows patient growing Proteus that is resistant to Macrobid but sensitive to ceftriaxone.  Pending CTs at time of signout which are reassuringly unremarkable.  Stool studies would be helpful in the setting of her persistent diarrhea but she currently has no GI follow-up.  On reevaluation, patient's daughter states that her mother is off of her baseline, sleeping all of the time and confused with repetitive questioning at home.  Given elderly status with recurrent UTIs and  altered mental status, patient will require admission.  Patient would benefit from routine gastroenterology consultation for her persistent diarrhea.       Teressa Lower, MD 09/28/21 [phone]

## 2021-09-28 NOTE — Assessment & Plan Note (Signed)
Admit to observation medical bed.  Start IV Rocephin.  Await urine cultures.

## 2021-09-28 NOTE — Assessment & Plan Note (Signed)
Baseline creatinine is about 1.7.  She is mildly hydrated.  Start gentle IV fluids.  Repeat BMP in the morning.

## 2021-09-28 NOTE — Assessment & Plan Note (Signed)
Likely due to her UTI and dehydration.  Continue with IV fluids.  Hopefully this will clear up with symptomatic treatment.

## 2021-09-28 NOTE — Assessment & Plan Note (Signed)
Patient's had diarrhea for 5 weeks.  Lomotil has not been effective.  Patient has remote history of C. difficile.  She has been on antibiotics recently in the last 1 to 2 months for upper respiratory infection.  C. difficile panel and GI viral panel have been ordered.

## 2021-09-28 NOTE — Consult Note (Signed)
UNASSIGNED CONSULT  Reason for Consult:Chronic diarrhea Referring Physician: [facility] HPI: This is an 86 year old female with a PMH of dysphagia (evaluated at [facility]), COPD on oxygen, PVD, and history of breast cancer admitted for complaints of diarrhea.  She states that her diarrhea started approximately 5 weeks ago.  On a daily basis she averages 4-5 loose nonbloody bowel movements.  The bowel movements are only during the day and she does not report any nocturnal waking for her diarrhea.  She does not report any problems with abdominal pain or vomiting, but she did have nausea two days ago.  Her PCP tried her on antibiotics at the outset of her symptoms, but it was not effective.  Four tablets of Imodium are helpful, but it does not resolve the diarrhea.  The patient denies having a prior colonoscopy.  There is a weight loss of 8 lbs, but she reports that it is as a result of her caretaker's cooking.  She states that her caretaker is very kind, but she is a horrible cook.  The patient prefers the hospital food.  Past Medical History:  Diagnosis Date   Breast cancer ([place])    COPD (chronic obstructive pulmonary disease) ([place])    2L but noncompliant   Peripheral vascular disease ([place])     Past Surgical History:  Procedure Laterality Date   APPENDECTOMY      History reviewed. No pertinent family history.  Social History:  reports that she has quit smoking. Her smoking use included cigarettes. She has never used smokeless tobacco. She reports that she does not currently use alcohol. She reports that she does not use drugs.  Allergies:  Allergies  Allergen Reactions   Ertapenem Other (See Comments)    Other reaction(s): Confusion She started getting confused on week 3 of treatment, resolved off the medication.     Penicillins Itching and Rash    But can take amoxicillin.      Procaine Rash    Childhood allergy    Alendronate Rash and Other (See  Comments)    osteomylititis      Medications: Scheduled:  ARIPiprazole  5 mg Oral Daily   buPROPion  150 mg Oral q morning   heparin  5,000 Units Subcutaneous Q8H   levETIRAcetam  250 mg Oral BID   oxybutynin  5 mg Oral TID   pravastatin  40 mg Oral q1800   sertraline  150 mg Oral Daily   Continuous:  [START ON 09/29/2021] cefTRIAXone (ROCEPHIN)  IV      Results for orders placed or performed during the hospital encounter of 09/27/21 (from the past 24 hour(s))  CBC with Differential     Status: Abnormal   Collection Time: 09/27/21 11:23 PM  Result Value Ref Range   WBC 7.2 4.0 - 10.5 K/uL   RBC 4.15 3.87 - 5.11 MIL/uL   Hemoglobin 13.5 12.0 - 15.0 g/dL   HCT 42.1 36.0 - 46.0 %   MCV 101.4 (H) 80.0 - 100.0 fL   MCH 32.5 26.0 - 34.0 pg   MCHC 32.1 30.0 - 36.0 g/dL   RDW 16.0 (H) 11.5 - 15.5 %   Platelets 187 150 - 400 K/uL   nRBC 0.0 0.0 - 0.2 %   Neutrophils Relative % 81 %   Neutro Abs 5.8 1.7 - 7.7 K/uL   Lymphocytes Relative 7 %   Lymphs Abs 0.5 (L) 0.7 - 4.0 K/uL   Monocytes Relative 11 %  Monocytes Absolute 0.8 0.1 - 1.0 K/uL   Eosinophils Relative 1 %   Eosinophils Absolute 0.0 0.0 - 0.5 K/uL   Basophils Relative 0 %   Basophils Absolute 0.0 0.0 - 0.1 K/uL   Immature Granulocytes 0 %   Abs Immature Granulocytes 0.03 0.00 - 0.07 K/uL  Comprehensive metabolic panel     Status: Abnormal   Collection Time: 09/27/21 11:23 PM  Result Value Ref Range   Sodium 140 135 - 145 mmol/L   Potassium 4.4 3.5 - 5.1 mmol/L   Chloride 111 98 - 111 mmol/L   CO2 20 (L) 22 - 32 mmol/L   Glucose, Bld 91 70 - 99 mg/dL   BUN 14 8 - 23 mg/dL   Creatinine, Ser 1.06 (H) 0.44 - 1.00 mg/dL   Calcium 9.1 8.9 - 10.3 mg/dL   Total Protein 6.9 6.5 - 8.1 g/dL   Albumin 3.9 3.5 - 5.0 g/dL   AST 22 15 - 41 U/L   ALT 13 0 - 44 U/L   Alkaline Phosphatase 107 38 - 126 U/L   Total Bilirubin 0.3 0.3 - 1.2 mg/dL   GFR, Estimated 52 (L) >60 mL/min   Anion gap 9 5 - 15  Lipase, blood      Status: None   Collection Time: 09/27/21 11:23 PM  Result Value Ref Range   Lipase 29 11 - 51 U/L  Urinalysis, Routine w reflex microscopic Urine, In & Out Cath     Status: Abnormal   Collection Time: 09/27/21 11:48 PM  Result Value Ref Range   Color, Urine YELLOW YELLOW   APPearance HAZY (A) CLEAR   Specific Gravity, Urine 1.017 1.005 - 1.030   pH 7.0 5.0 - 8.0   Glucose, UA NEGATIVE NEGATIVE mg/dL   Hgb urine dipstick NEGATIVE NEGATIVE   Bilirubin Urine NEGATIVE NEGATIVE   Ketones, ur NEGATIVE NEGATIVE mg/dL   Protein, ur NEGATIVE NEGATIVE mg/dL   Nitrite POSITIVE (A) NEGATIVE   Leukocytes,Ua LARGE (A) NEGATIVE   RBC / HPF 0-5 0 - 5 RBC/hpf   WBC, UA >50 (H) 0 - 5 WBC/hpf   Bacteria, UA RARE (A) NONE SEEN   Squamous Epithelial / LPF 0-5 0 - 5   Mucus PRESENT    Hyaline Casts, UA PRESENT    Non Squamous Epithelial 0-5 (A) NONE SEEN     CT Head Wo Contrast  Result Date: 09/28/2021 CLINICAL DATA:  Altered mental status EXAM: CT HEAD WITHOUT CONTRAST TECHNIQUE: Contiguous axial images were obtained from the base of the skull through the vertex without intravenous contrast. RADIATION DOSE REDUCTION: This exam was performed according to the departmental dose-optimization program which includes automated exposure control, adjustment of the mA and/or kV according to patient size and/or use of iterative reconstruction technique. COMPARISON:  None Available. FINDINGS: Brain: There is no mass, hemorrhage or extra-axial collection. There is generalized atrophy without lobar predilection. Hypodensity of the white matter is most commonly associated with chronic microvascular disease. Old right basal ganglia small vessel infarct. Vascular: Atherosclerotic calcification of the internal carotid arteries at the skull base. No abnormal hyperdensity of the major intracranial arteries or dural venous sinuses. Skull: The visualized skull base, calvarium and extracranial soft tissues are normal.  Sinuses/Orbits: No fluid levels or advanced mucosal thickening of the visualized paranasal sinuses. No mastoid or middle ear effusion. The orbits are normal. IMPRESSION: 1. No acute intracranial abnormality. 2. Chronic microvascular ischemia and generalized atrophy. Electronically Signed   By: Cletus Gash.D.  On: 09/28/2021 02:15   CT ABDOMEN PELVIS W CONTRAST  Result Date: 09/28/2021 CLINICAL DATA:  Nausea and vomiting EXAM: CT ABDOMEN AND PELVIS WITH CONTRAST TECHNIQUE: Multidetector CT imaging of the abdomen and pelvis was performed using the standard protocol following bolus administration of intravenous contrast. RADIATION DOSE REDUCTION: This exam was performed according to the departmental dose-optimization program which includes automated exposure control, adjustment of the mA and/or kV according to patient size and/or use of iterative reconstruction technique. CONTRAST:  90m OMNIPAQUE IOHEXOL 300 MG/ML  SOLN COMPARISON:  None Available. FINDINGS: Lower Chest: Normal. Hepatobiliary: Normal hepatic contours. Mild biliary dilatation with common bile duct measuring 10 mm. There is mild intrahepatic dilatation as well. There is cholelithiasis without acute inflammation. Pancreas: Pancreas appears normal. Pancreatic duct is at upper limits of normal. Spleen: Normal. Adrenals/Urinary Tract: The adrenal glands are normal. No hydronephrosis, nephroureterolithiasis or solid renal mass. Left interpolar renal cyst measures 7.2 cm. This is Bosniak class 1 and no follow-up imaging is recommended. The urinary bladder is normal for degree of distention Stomach/Bowel: There is no hiatal hernia. Normal duodenal course and caliber. No small bowel dilatation or inflammation. No focal colonic abnormality. Normal appendix. Vascular/Lymphatic: There is calcific atherosclerosis of the abdominal aorta. No lymphadenopathy. Reproductive: Status post hysterectomy. No adnexal mass. Other: Status post hernia repair.  Musculoskeletal: Bilateral hip arthroplasties. Chronic compression fracture of L3 with pars interarticularis defects and grade 1 anterolisthesis. IMPRESSION: 1. No acute abnormality of the abdomen or pelvis. 2. Mild intrahepatic and extrahepatic biliary dilatation. 3. Chronic compression fracture of L3 with pars interarticularis defects and grade 1 anterolisthesis. 4. Aortic Atherosclerosis (ICD10-I70.0). Electronically Signed   By: KUlyses JarredM.D.   On: 09/28/2021 02:08   DG Chest 1 View  Result Date: 09/28/2021 CLINICAL DATA:  Altered mental status.  Nausea and vomiting. EXAM: CHEST  1 VIEW COMPARISON:  Remote radiograph 11/06/2009 FINDINGS: The cardiomediastinal contours are normal. Diffuse thoracic aortic atherosclerosis. Mild bibasilar atelectasis. Pulmonary vasculature is normal. No consolidation, pleural effusion, or pneumothorax. No acute osseous abnormalities are seen. Probable left calcified breast lesion IMPRESSION: 1. Mild bibasilar atelectasis. 2.  Aortic Atherosclerosis (ICD10-I70.0). Electronically Signed   By: MKeith RakeM.D.   On: 09/28/2021 00:29    ROS:  As stated above in the HPI otherwise negative.  Blood pressure (!) 94/52, pulse 73, temperature 98.2 F (36.8 C), temperature source Oral, resp. rate 18, height '4\' 10"'$  (1.473 m), weight 47.7 kg, SpO2 93 %.    PE: Gen: NAD, Alert and Oriented HEENT:  [place]/AT, EOMI Neck: Supple, no LAD Lungs: CTA Bilaterally CV: RRR without M/G/R ABD: Soft, NTND, +BS Ext: No C/C/E  Assessment/Plan: 1) Chronic diarrhea. 2) Weight loss - non pathologic. 3) COPD.  Plan: 1) Await stool studies. 2) If negative, she will be prepped for a colonoscopy.  Aara Jacquot D 09/28/2021, 5:27 PM

## 2021-09-28 NOTE — Plan of Care (Addendum)
Patient seen and rounded on this morning.  Admitted after midnight.  See H&P for full A&P.   Michele Harper is an 86 yo female who presented with diarrhea and confusion.  She has been having diarrhea for several weeks and has planned to follow-up with GI outpatient but has not yet been seen. She was on antibiotics about 1 to 2 months ago. Urinalysis was notable for positive nitrite, large LE, greater than 50 WBC, rare bacteria, hyaline casts present.  WBC was 7.2 on admission. CT abdomen/pelvis was unremarkable for pyelonephritis on radiographic imaging and was essentially a benign scan overall. She was started on Rocephin for presumed UTI.  Stool studies were also ordered for ongoing diarrhea.  Plan: - see H&P for full plan - obtain stool studies for cdiff and GI panel  - follow up urine culture; continue Rocephin - Continue liquid diet and fluids.  Patient still unable to take in adequate nutrition due to nausea and diarrhea therefore still requiring fluids  Dwyane Dee, MD [facility] 09/28/2021, 11:00 AM

## 2021-09-28 NOTE — Assessment & Plan Note (Signed)
Noncompliant with wearing oxygen at home.  Confirmed by the daughter.

## 2021-09-28 NOTE — Progress Notes (Signed)
PT Cancellation Note  Patient Details Name: Michele Harper MRN: [id number] DOB: 1935/04/23   Cancelled Treatment:    Reason Eval/Treat Not Completed: Fatigue/lethargy limiting ability to participate, attempted eval in AM, patient resting in bed  and did not feel up to walking. [employer] [facility] [phone] Weekend [phone]    Claretha Cooper 09/28/2021, 3:07 PM

## 2021-09-28 NOTE — Plan of Care (Signed)
  Problem: Nutrition: Goal: Adequate nutrition will be maintained Outcome: Progressing   Problem: Pain Managment: Goal: General experience of comfort will improve Outcome: Progressing   

## 2021-09-28 NOTE — Subjective & Objective (Signed)
CC: N/V, diarrhea, confusion HPI: 86 year old white female history of COPD, chronic hypoxic respite failure on 2 L of oxygen which she is not compliant on, prior history of breast cancer, history of small bowel obstruction, presents to the ER today with diarrhea for about 5 weeks, nausea vomiting for about a week, confusion for the last 24 to 48 hours.  Her daughter Jinny Blossom is at her bedside.  Patient has been eating very little over the last couple days due to poor appetite and diarrhea.  She has been having diarrhea for the last 4 to 5 weeks.  PCP has ordered a GI consult which she has not yet to see.  She has been losing weight about 8 pounds over last week.  No fevers per se but the patient was confused.  She is unable to walk.  She normally uses a walker.  She lives with a 24-hour caretaker.  Daughter lives down the street from her.  On arrival to the ER, temp 98.2 heart rate 94 blood pressure 141/96.  Labs showed a UA that was positive for nitrates positive for esterase positive bacteria.  Sodium 140, bicarb 20, BUN of 14, creatinine 1.06  White count 7.2, hemoglobin 13.5, platelets 187  CT head showed no acute intracranial abnormality.  CT abdomen pelvis demonstrated no acute intra-abdominal process.  Chest x-ray showed bibasilar atelectasis.  Due to UTI and patient's confusion, [facility] contacted for admission.

## 2021-09-29 DIAGNOSIS — N3000 Acute cystitis without hematuria: Secondary | ICD-10-CM | POA: Diagnosis not present

## 2021-09-29 LAB — GASTROINTESTINAL PANEL BY PCR, STOOL (REPLACES STOOL CULTURE)

## 2021-09-29 LAB — CBC WITH DIFFERENTIAL/PLATELET
Abs Immature Granulocytes: 0.03 10*3/uL (ref 0.00–0.07)
Basophils Absolute: 0 10*3/uL (ref 0.0–0.1)
Basophils Relative: 0 %
Eosinophils Absolute: 0 10*3/uL (ref 0.0–0.5)
Eosinophils Relative: 0 %
HCT: 34.5 % — ABNORMAL LOW (ref 36.0–46.0)
Hemoglobin: 11 g/dL — ABNORMAL LOW (ref 12.0–15.0)
Immature Granulocytes: 1 %
Lymphocytes Relative: 15 %
Lymphs Abs: 0.8 10*3/uL (ref 0.7–4.0)
MCH: 32.3 pg (ref 26.0–34.0)
MCHC: 31.9 g/dL (ref 30.0–36.0)
MCV: 101.2 fL — ABNORMAL HIGH (ref 80.0–100.0)
Monocytes Absolute: 0.4 10*3/uL (ref 0.1–1.0)
Monocytes Relative: 8 %
Neutro Abs: 3.8 10*3/uL (ref 1.7–7.7)
Neutrophils Relative %: 76 %
Platelets: 161 10*3/uL (ref 150–400)
RBC: 3.41 MIL/uL — ABNORMAL LOW (ref 3.87–5.11)
RDW: 16.1 % — ABNORMAL HIGH (ref 11.5–15.5)
WBC: 5 10*3/uL (ref 4.0–10.5)
nRBC: 0 % (ref 0.0–0.2)

## 2021-09-29 LAB — BASIC METABOLIC PANEL
Anion gap: 9 (ref 5–15)
BUN: 10 mg/dL (ref 8–23)
CO2: 21 mmol/L — ABNORMAL LOW (ref 22–32)
Calcium: 8.1 mg/dL — ABNORMAL LOW (ref 8.9–10.3)
Chloride: 113 mmol/L — ABNORMAL HIGH (ref 98–111)
Creatinine, Ser: 1.01 mg/dL — ABNORMAL HIGH (ref 0.44–1.00)
GFR, Estimated: 55 mL/min — ABNORMAL LOW (ref >60)
Glucose, Bld: 92 mg/dL (ref 70–99)
Potassium: 3.1 mmol/L — ABNORMAL LOW (ref 3.5–5.1)
Sodium: 143 mmol/L (ref 135–145)

## 2021-09-29 LAB — MAGNESIUM: Magnesium: 1.5 mg/dL — ABNORMAL LOW (ref 1.7–2.4)

## 2021-09-29 MED ORDER — MAGNESIUM SULFATE 2 GM/50ML IV SOLN
2.0000 g | Freq: Once | INTRAVENOUS | Status: AC
  Administered 2021-09-29: 2 g via INTRAVENOUS
  Filled 2021-09-29: qty 50

## 2021-09-29 MED ORDER — POTASSIUM CHLORIDE 20 MEQ PO PACK
40.0000 meq | PACK | Freq: Once | ORAL | Status: AC
  Administered 2021-09-29: 40 meq via ORAL
  Filled 2021-09-29: qty 2

## 2021-09-29 MED ORDER — PEG 3350-KCL-NA BICARB-NACL 420 G PO SOLR
4000.0000 mL | Freq: Once | ORAL | Status: AC
Start: 2021-09-29 — End: 2021-09-29
  Administered 2021-09-29: 4000 mL via ORAL

## 2021-09-29 NOTE — H&P (View-Only) (Signed)
UNASSIGNED PATIENT Subjective: Michele Harper is a 86 year old white female with multiple medical problems including COPD and chronic respiratory failure on 2 L of oxygen in the past history of breast cancer and history of small bowel obstruction presented to the emergency room with a 5-week history of severe diarrhea nausea and vomiting with confusion for 48 hours prior to admission. She had extensive workup with all the GI pathogen panel being negative. C. Difficile toxin assay is negative as well  Objective: Vital signs in last 24 hours: Temp:  [97.5 F (36.4 C)-98.8 F (37.1 C)] 97.5 F (36.4 C) (08/09 1410) Pulse Rate:  [79-86] 79 (08/09 1410) Resp:  [16-18] 18 (08/09 1410) BP: (112-142)/(57-64) 117/57 (08/09 1410) SpO2:  [94 %-100 %] 100 % (08/09 1410) Weight:  [49.4 kg] 49.4 kg (08/09 0500) Last BM Date : 09/28/21  Intake/Output from previous day: 08/08 0701 - 08/09 0700 In: 240 [P.O.:140; IV Piggyback:100] Out: -  Intake/Output this shift: Total I/O In: 148.8 [IV Piggyback:148.8] Out: -   General appearance: alert, cooperative, appears stated age, fatigued, and no distress Resp: clear to auscultation bilaterally Cardio: regular rate and rhythm, S1, S2 normal, no murmur, click, rub or gallop GI: soft, non-tender; bowel sounds normal; no masses,  no organomegaly  Lab Results: Recent Labs    09/27/21 2323 09/29/21 0322  WBC 7.2 5.0  HGB 13.5 11.0*  HCT 42.1 34.5*  PLT 187 161   BMET Recent Labs    09/27/21 2323 09/29/21 0543  NA 140 143  K 4.4 3.1*  CL 111 113*  CO2 20* 21*  GLUCOSE 91 92  BUN 14 10  CREATININE 1.06* 1.01*  CALCIUM 9.1 8.1*   LFT Recent Labs    09/27/21 2323  PROT 6.9  ALBUMIN 3.9  AST 22  ALT 13  ALKPHOS 107  BILITOT 0.3    C-Diff Recent Labs    09/27/21 1701  CDIFFTOX NEGATIVE   Studies/Results: CT Head Wo Contrast  Result Date: 09/28/2021 CLINICAL DATA:  Altered mental status EXAM: CT HEAD WITHOUT CONTRAST TECHNIQUE:  Contiguous axial images were obtained from the base of the skull through the vertex without intravenous contrast. RADIATION DOSE REDUCTION: This exam was performed according to the departmental dose-optimization program which includes automated exposure control, adjustment of the mA and/or kV according to patient size and/or use of iterative reconstruction technique. COMPARISON:  None Available. FINDINGS: Brain: There is no mass, hemorrhage or extra-axial collection. There is generalized atrophy without lobar predilection. Hypodensity of the white matter is most commonly associated with chronic microvascular disease. Old right basal ganglia small vessel infarct. Vascular: Atherosclerotic calcification of the internal carotid arteries at the skull base. No abnormal hyperdensity of the major intracranial arteries or dural venous sinuses. Skull: The visualized skull base, calvarium and extracranial soft tissues are normal. Sinuses/Orbits: No fluid levels or advanced mucosal thickening of the visualized paranasal sinuses. No mastoid or middle ear effusion. The orbits are normal. IMPRESSION: 1. No acute intracranial abnormality. 2. Chronic microvascular ischemia and generalized atrophy. Electronically Signed   By: Ulyses Jarred M.D.   On: 09/28/2021 02:15   CT ABDOMEN PELVIS W CONTRAST  Result Date: 09/28/2021 CLINICAL DATA:  Nausea and vomiting EXAM: CT ABDOMEN AND PELVIS WITH CONTRAST TECHNIQUE: Multidetector CT imaging of the abdomen and pelvis was performed using the standard protocol following bolus administration of intravenous contrast. RADIATION DOSE REDUCTION: This exam was performed according to the departmental dose-optimization program which includes automated exposure control, adjustment of the mA and/or  kV according to patient size and/or use of iterative reconstruction technique. CONTRAST:  73m OMNIPAQUE IOHEXOL 300 MG/ML  SOLN COMPARISON:  None Available. FINDINGS: Lower Chest: Normal. Hepatobiliary:  Normal hepatic contours. Mild biliary dilatation with common bile duct measuring 10 mm. There is mild intrahepatic dilatation as well. There is cholelithiasis without acute inflammation. Pancreas: Pancreas appears normal. Pancreatic duct is at upper limits of normal. Spleen: Normal. Adrenals/Urinary Tract: The adrenal glands are normal. No hydronephrosis, nephroureterolithiasis or solid renal mass. Left interpolar renal cyst measures 7.2 cm. This is Bosniak class 1 and no follow-up imaging is recommended. The urinary bladder is normal for degree of distention Stomach/Bowel: There is no hiatal hernia. Normal duodenal course and caliber. No small bowel dilatation or inflammation. No focal colonic abnormality. Normal appendix. Vascular/Lymphatic: There is calcific atherosclerosis of the abdominal aorta. No lymphadenopathy. Reproductive: Status post hysterectomy. No adnexal mass. Other: Status post hernia repair. Musculoskeletal: Bilateral hip arthroplasties. Chronic compression fracture of L3 with pars interarticularis defects and grade 1 anterolisthesis. IMPRESSION: 1. No acute abnormality of the abdomen or pelvis. 2. Mild intrahepatic and extrahepatic biliary dilatation. 3. Chronic compression fracture of L3 with pars interarticularis defects and grade 1 anterolisthesis. 4. Aortic Atherosclerosis (ICD10-I70.0). Electronically Signed   By: KUlyses JarredM.D.   On: 09/28/2021 02:08   DG Chest 1 View  Result Date: 09/28/2021 CLINICAL DATA:  Altered mental status.  Nausea and vomiting. EXAM: CHEST  1 VIEW COMPARISON:  Remote radiograph 11/06/2009 FINDINGS: The cardiomediastinal contours are normal. Diffuse thoracic aortic atherosclerosis. Mild bibasilar atelectasis. Pulmonary vasculature is normal. No consolidation, pleural effusion, or pneumothorax. No acute osseous abnormalities are seen. Probable left calcified breast lesion IMPRESSION: 1. Mild bibasilar atelectasis. 2.  Aortic Atherosclerosis (ICD10-I70.0).  Electronically Signed   By: MKeith RakeM.D.   On: 09/28/2021 00:29    Medications: I have reviewed the patient's current medications. Prior to Admission:  Medications Prior to Admission  Medication Sig Dispense Refill Last Dose   acetaminophen (TYLENOL) 500 MG tablet Take 500 mg by mouth 2 (two) times daily.   09/27/2021 at am   ANTI-DIARRHEAL 2 MG tablet Take 1 tablet by mouth 4 (four) times daily as needed for diarrhea or loose stools.   09/27/2021 at 2000   ARIPiprazole (ABILIFY) 5 MG tablet Take 5 mg by mouth daily.   09/27/2021   buPROPion (WELLBUTRIN XL) 150 MG 24 hr tablet Take 150 mg by mouth every morning.   09/27/2021   levETIRAcetam (KEPPRA) 250 MG tablet Take 250 mg by mouth 2 (two) times daily.   09/27/2021 at pm   lovastatin (MEVACOR) 40 MG tablet Take 40 mg by mouth at bedtime.   09/27/2021   oxybutynin (DITROPAN) 5 MG tablet Take 5 mg by mouth 3 (three) times daily.   09/27/2021 at pm   pantoprazole (PROTONIX) 40 MG tablet Take 40 mg by mouth daily.   09/27/2021   potassium chloride SA (KLOR-CON M) 20 MEQ tablet Take 20 mEq by mouth 2 (two) times daily.   09/27/2021 at pm   sertraline (ZOLOFT) 100 MG tablet Take 150 mg by mouth daily.   09/27/2021   traZODone (DESYREL) 50 MG tablet Take 25 mg by mouth at bedtime.   09/27/2021   Scheduled:  ARIPiprazole  5 mg Oral Daily   buPROPion  150 mg Oral q morning   heparin  5,000 Units Subcutaneous Q8H   levETIRAcetam  250 mg Oral BID   oxybutynin  5 mg Oral TID   polyethylene  glycol-electrolytes  4,000 mL Oral Once   pravastatin  40 mg Oral q1800   sertraline  150 mg Oral Daily   Continuous:  cefTRIAXone (ROCEPHIN)  IV 1 g (09/29/21 0132)    Assessment/Plan: 1) Severe diarrhea for the last 4 to 5 weeks-C. difficile toxin assay and GI pathogen panel has been negative/8 lb weight loss in 1 week-proceed with a colonoscopy tomorrow.  Prep orders written.  I advised the family to avoid the use of all artificial sweeteners including Splenda Stevia  diet drinks etc. as this can cause a osmotic diarrhea as well. 2) COPD/chronic hypoxic respiratory failure on O2 at home. 3) Acute cystitis with hematuria improving on antibiotics. 4) Acute kidney injury secondary dehydration.  LOS: 0 days   Juanita Craver 09/29/2021, 3:38 PM

## 2021-09-29 NOTE — Plan of Care (Signed)

## 2021-09-29 NOTE — TOC Initial Note (Signed)
Transition of Care [facility]) - Initial/Assessment Note   Patient Details  Name: Michele Harper MRN: [id number] Date of Birth: [birth year]  Transition of Care [facility]) CM/SW Contact:    Sherie Don, LCSW Phone Number: 09/29/2021, 3:20 PM  Clinical Narrative: PT evaluation recommended HHPT. CSW spoke with patient and patient confirmed she is active with [facility] for [facility]. CSW followed up with Delsa Sale with [facility] and confirmed patient is active with PT and OT. Patient will need [place] orders for PT and OT prior to discharge.  Expected Discharge Plan: [place] Barriers to Discharge: Continued Medical Work up  Patient Goals and CMS Choice Patient states their goals for this hospitalization and ongoing recovery are:: Resume HH with [facility] CMS Medicare.gov Compare Post Acute Care list provided to:: Patient Choice offered to / list presented to : Patient  Expected Discharge Plan and Services Expected Discharge Plan: [place] In-house Referral: Clinical Social Work Post Acute Care Choice: [place] arrangements for the past 2 months: Apartment            DME Arranged: N/A DME Agency: NA HH Arranged: PT, OT HH Agency: [facility] [place] Date [place]: 09/29/21 Representative spoke with at [place]: [facility]  Prior Living Arrangements/Services Living arrangements for the past 2 months: Apartment Patient language and need for interpreter reviewed:: Yes Do you feel safe going back to the place where you live?: Yes      Need for Family Participation in Patient Care: Yes (Comment) Care giver support system in place?: Yes (comment) Criminal Activity/Legal Involvement Pertinent to Current Situation/Hospitalization: No - Comment as needed  Activities of Daily Living Home Assistive Devices/Equipment: Bedside commode/3-in-1, Dentures (specify type), Eyeglasses, Grab bars around toilet, Grab bars in shower, Walker (specify type) ADL Screening  (condition at time of admission) Patient's cognitive ability adequate to safely complete daily activities?: No Is the patient deaf or have difficulty hearing?: No Does the patient have difficulty seeing, even when wearing glasses/contacts?: No Does the patient have difficulty concentrating, remembering, or making decisions?: Yes Patient able to express need for assistance with ADLs?: Yes Does the patient have difficulty dressing or bathing?: Yes Independently performs ADLs?: No Communication: Independent Dressing (OT): Needs assistance Is this a change from baseline?: Pre-admission baseline Grooming: Needs assistance Is this a change from baseline?: Pre-admission baseline Feeding: Needs assistance Is this a change from baseline?: Pre-admission baseline Bathing: Needs assistance Is this a change from baseline?: Pre-admission baseline Toileting: Needs assistance Is this a change from baseline?: Pre-admission baseline In/Out Bed: Needs assistance Is this a change from baseline?: Pre-admission baseline Walks in Home: Needs assistance Is this a change from baseline?: Pre-admission baseline Does the patient have difficulty walking or climbing stairs?: Yes Weakness of Legs: Both Weakness of Arms/Hands: Left  Permission Sought/Granted Permission sought to share information with : Other (comment) Permission granted to share information with : Yes, Verbal Permission Granted Permission granted to share info w AGENCY: [facility]  Emotional Assessment Attitude/Demeanor/Rapport: Engaged Affect (typically observed): Accepting Orientation: : Oriented to Self, Oriented to Place, Oriented to  Time, Oriented to Situation Alcohol / Substance Use: Not Applicable Psych Involvement: No (comment)  Admission diagnosis:  Acute cystitis without hematuria [N30.00] Altered mental status, unspecified altered mental status type [R41.82] Diarrhea, unspecified type [R19.7] Patient Active Problem List    Diagnosis Date Noted   Acute cystitis without hematuria 09/28/2021   Chronic respiratory failure with hypoxia ([place]) -  noncompliant with 2 L/min 09/28/2021   Chronic diarrhea 09/28/2021   Dehydration 37/05/8887   Acute metabolic encephalopathy 16/94/5038   Seizure disorder ([place]) 03/29/2017   Chronic obstructive pulmonary disease, unspecified ([place]) 04/09/2015   Chronic anemia 07/12/2012   PCP:  Wayland Salinas, MD Pharmacy:   [place], [place] - [address] [place] [address] Phone: [phone] Fax: [phone]  Readmission Risk Interventions     No data to display

## 2021-09-29 NOTE — Evaluation (Signed)
Physical Therapy Evaluation Patient Details Name: Michele Harper MRN: [id number] DOB: May 13, 1935 Today's Date: 09/29/2021  History of Present Illness  Pt is an 86 year old female presenting to ED with diarrhea.  Pt admitted 09/27/21 for Acute cystitis without hematuria and dehydration. PMHx: COPD, PVD, breast cancer  Clinical Impression  Pt admitted with above diagnosis.  Pt currently with functional limitations due to the deficits listed below (see PT Problem List). Pt will benefit from skilled PT to increase their independence and safety with mobility to allow discharge to the venue listed below.  Pt assisted to [facility] and then ambulating in hallway which was limited by incontinent episode.  Pt feels she can return home with caregiver assist and agreeable to HHPT (states she was receiving prior to this admission).        Recommendations for follow up therapy are one component of a multi-disciplinary discharge planning process, led by the attending physician.  Recommendations may be updated based on patient status, additional functional criteria and insurance authorization.  Follow Up Recommendations Home health PT (resume HHPT)      Assistance Recommended at Discharge Intermittent Supervision/Assistance  Patient can return home with the following  A little help with walking and/or transfers;A little help with bathing/dressing/bathroom;Assist for transportation;Help with stairs or ramp for entrance;Assistance with cooking/housework    Equipment Recommendations None recommended by PT  Recommendations for Other Services       Functional Status Assessment Patient has had a recent decline in their functional status and demonstrates the ability to make significant improvements in function in a reasonable and predictable amount of time.     Precautions / Restrictions Precautions Precautions: Fall Precaution Comments: incontinent of stool, 2L O2 [place] baseline      Mobility  Bed Mobility Overal  bed mobility: Needs Assistance Bed Mobility: Supine to Sit     Supine to sit: Min assist     General bed mobility comments: light assist for trunk upright    Transfers Overall transfer level: Needs assistance Equipment used: Rolling walker (2 wheels) Transfers: Sit to/from Stand, Bed to chair/wheelchair/BSC Sit to Stand: Min guard Stand pivot transfers: Min guard         General transfer comment: min/guard for safety, cues for hand placement; pt assisted to [facility] prior to ambulating however did not have BM    Ambulation/Gait Ambulation/Gait assistance: Min guard Gait Distance (Feet): 30 Feet Assistive device: Rolling walker (2 wheels) Gait Pattern/deviations: Step-through pattern, Decreased stride length       General Gait Details: verbal cues for posture, pt reports weakness and fatigue; pt incontinent of bowels so had staff bring recliner to pt (hallway sanitized appropriately)  Stairs            Wheelchair Mobility    Modified Rankin (Stroke Patients Only)       Balance Overall balance assessment: Needs assistance         Standing balance support: Bilateral upper extremity supported, Reliant on assistive device for balance Standing balance-Leahy Scale: Poor                               Pertinent Vitals/Pain Pain Assessment Pain Assessment: No/denies pain    Home Living Family/patient expects to be discharged to:: Private residence Living Arrangements: Alone Available Help at Discharge: Available 24 hours/day;Personal care attendant;Neighbor Type of Home: House         Home Layout: One level Home Equipment: Rolling  Walker (2 wheels)      Prior Function Prior Level of Function : Needs assist             Mobility Comments: uses RW at baseline ADLs Comments: reports caregiver assists with ADLs and IADLs, pt's neighbor also assists as needed per pt     Hand Dominance        Extremity/Trunk Assessment        Lower  Extremity Assessment Lower Extremity Assessment: Generalized weakness    Cervical / Trunk Assessment Cervical / Trunk Assessment: Kyphotic  Communication   Communication: No difficulties  Cognition Arousal/Alertness: Awake/alert Behavior During Therapy: WFL for tasks assessed/performed Overall Cognitive Status: Within Functional Limits for tasks assessed                                          General Comments General comments (skin integrity, edema, etc.): pt denies any recent falls    Exercises     Assessment/Plan    PT Assessment Patient needs continued PT services  PT Problem List Decreased mobility;Decreased activity tolerance;Decreased strength;Decreased balance       PT Treatment Interventions Gait training;DME instruction;Therapeutic exercise;Functional mobility training;Therapeutic activities;Patient/family education;Balance training    PT Goals (Current goals can be found in the Care Plan section)  Acute Rehab PT Goals PT Goal Formulation: With patient Time For Goal Achievement: 10/13/21 Potential to Achieve Goals: Good    Frequency Min 3X/week     Co-evaluation               AM-PAC PT "6 Clicks" Mobility  Outcome Measure Help needed turning from your back to your side while in a flat bed without using bedrails?: A Little Help needed moving from lying on your back to sitting on the side of a flat bed without using bedrails?: A Little Help needed moving to and from a bed to a chair (including a wheelchair)?: A Little Help needed standing up from a chair using your arms (e.g., wheelchair or bedside chair)?: A Little Help needed to walk in hospital room?: A Little Help needed climbing 3-5 steps with a railing? : A Lot 6 Click Score: 17    End of Session Equipment Utilized During Treatment: Gait belt;Oxygen Activity Tolerance: Patient tolerated treatment well Patient left: in chair;with call bell/phone within reach;with chair alarm  set Nurse Communication: Mobility status PT Visit Diagnosis: Difficulty in walking, not elsewhere classified (R26.2);Muscle weakness (generalized) (M62.81)    Time: 0865-7846 PT Time Calculation (min) (ACUTE ONLY): 25 min   Charges:   PT Evaluation $PT Eval Low Complexity: 1 Low PT Treatments $Gait Training: 8-22 mins       Jannette Spanner PT, DPT Physical Therapist Acute Rehabilitation Services Preferred contact method: Secure Chat Weekend Pager Only: [phone] Office: [place] 09/29/2021, 1:46 PM

## 2021-09-29 NOTE — Progress Notes (Signed)
UNASSIGNED PATIENT Subjective: Michele Harper is a 86 year old white female with multiple medical problems including COPD and chronic respiratory failure on 2 L of oxygen in the past history of breast cancer and history of small bowel obstruction presented to the emergency room with a 5-week history of severe diarrhea nausea and vomiting with confusion for 48 hours prior to admission. She had extensive workup with all the GI pathogen panel being negative. C. Difficile toxin assay is negative as well  Objective: Vital signs in last 24 hours: Temp:  [97.5 F (36.4 C)-98.8 F (37.1 C)] 97.5 F (36.4 C) (08/09 1410) Pulse Rate:  [79-86] 79 (08/09 1410) Resp:  [16-18] 18 (08/09 1410) BP: (112-142)/(57-64) 117/57 (08/09 1410) SpO2:  [94 %-100 %] 100 % (08/09 1410) Weight:  [49.4 kg] 49.4 kg (08/09 0500) Last BM Date : 09/28/21  Intake/Output from previous day: 08/08 0701 - 08/09 0700 In: 240 [P.O.:140; IV Piggyback:100] Out: -  Intake/Output this shift: Total I/O In: 148.8 [IV Piggyback:148.8] Out: -   General appearance: alert, cooperative, appears stated age, fatigued, and no distress Resp: clear to auscultation bilaterally Cardio: regular rate and rhythm, S1, S2 normal, no murmur, click, rub or gallop GI: soft, non-tender; bowel sounds normal; no masses,  no organomegaly  Lab Results: Recent Labs    09/27/21 2323 09/29/21 0322  WBC 7.2 5.0  HGB 13.5 11.0*  HCT 42.1 34.5*  PLT 187 161   BMET Recent Labs    09/27/21 2323 09/29/21 0543  NA 140 143  K 4.4 3.1*  CL 111 113*  CO2 20* 21*  GLUCOSE 91 92  BUN 14 10  CREATININE 1.06* 1.01*  CALCIUM 9.1 8.1*   LFT Recent Labs    09/27/21 2323  PROT 6.9  ALBUMIN 3.9  AST 22  ALT 13  ALKPHOS 107  BILITOT 0.3    C-Diff Recent Labs    09/27/21 1701  CDIFFTOX NEGATIVE   Studies/Results: CT Head Wo Contrast  Result Date: 09/28/2021 CLINICAL DATA:  Altered mental status EXAM: CT HEAD WITHOUT CONTRAST TECHNIQUE:  Contiguous axial images were obtained from the base of the skull through the vertex without intravenous contrast. RADIATION DOSE REDUCTION: This exam was performed according to the departmental dose-optimization program which includes automated exposure control, adjustment of the mA and/or kV according to patient size and/or use of iterative reconstruction technique. COMPARISON:  None Available. FINDINGS: Brain: There is no mass, hemorrhage or extra-axial collection. There is generalized atrophy without lobar predilection. Hypodensity of the white matter is most commonly associated with chronic microvascular disease. Old right basal ganglia small vessel infarct. Vascular: Atherosclerotic calcification of the internal carotid arteries at the skull base. No abnormal hyperdensity of the major intracranial arteries or dural venous sinuses. Skull: The visualized skull base, calvarium and extracranial soft tissues are normal. Sinuses/Orbits: No fluid levels or advanced mucosal thickening of the visualized paranasal sinuses. No mastoid or middle ear effusion. The orbits are normal. IMPRESSION: 1. No acute intracranial abnormality. 2. Chronic microvascular ischemia and generalized atrophy. Electronically Signed   By: Ulyses Jarred M.D.   On: 09/28/2021 02:15   CT ABDOMEN PELVIS W CONTRAST  Result Date: 09/28/2021 CLINICAL DATA:  Nausea and vomiting EXAM: CT ABDOMEN AND PELVIS WITH CONTRAST TECHNIQUE: Multidetector CT imaging of the abdomen and pelvis was performed using the standard protocol following bolus administration of intravenous contrast. RADIATION DOSE REDUCTION: This exam was performed according to the departmental dose-optimization program which includes automated exposure control, adjustment of the mA and/or  kV according to patient size and/or use of iterative reconstruction technique. CONTRAST:  59m OMNIPAQUE IOHEXOL 300 MG/ML  SOLN COMPARISON:  None Available. FINDINGS: Lower Chest: Normal. Hepatobiliary:  Normal hepatic contours. Mild biliary dilatation with common bile duct measuring 10 mm. There is mild intrahepatic dilatation as well. There is cholelithiasis without acute inflammation. Pancreas: Pancreas appears normal. Pancreatic duct is at upper limits of normal. Spleen: Normal. Adrenals/Urinary Tract: The adrenal glands are normal. No hydronephrosis, nephroureterolithiasis or solid renal mass. Left interpolar renal cyst measures 7.2 cm. This is Bosniak class 1 and no follow-up imaging is recommended. The urinary bladder is normal for degree of distention Stomach/Bowel: There is no hiatal hernia. Normal duodenal course and caliber. No small bowel dilatation or inflammation. No focal colonic abnormality. Normal appendix. Vascular/Lymphatic: There is calcific atherosclerosis of the abdominal aorta. No lymphadenopathy. Reproductive: Status post hysterectomy. No adnexal mass. Other: Status post hernia repair. Musculoskeletal: Bilateral hip arthroplasties. Chronic compression fracture of L3 with pars interarticularis defects and grade 1 anterolisthesis. IMPRESSION: 1. No acute abnormality of the abdomen or pelvis. 2. Mild intrahepatic and extrahepatic biliary dilatation. 3. Chronic compression fracture of L3 with pars interarticularis defects and grade 1 anterolisthesis. 4. Aortic Atherosclerosis (ICD10-I70.0). Electronically Signed   By: KUlyses JarredM.D.   On: 09/28/2021 02:08   DG Chest 1 View  Result Date: 09/28/2021 CLINICAL DATA:  Altered mental status.  Nausea and vomiting. EXAM: CHEST  1 VIEW COMPARISON:  Remote radiograph 11/06/2009 FINDINGS: The cardiomediastinal contours are normal. Diffuse thoracic aortic atherosclerosis. Mild bibasilar atelectasis. Pulmonary vasculature is normal. No consolidation, pleural effusion, or pneumothorax. No acute osseous abnormalities are seen. Probable left calcified breast lesion IMPRESSION: 1. Mild bibasilar atelectasis. 2.  Aortic Atherosclerosis (ICD10-I70.0).  Electronically Signed   By: MKeith RakeM.D.   On: 09/28/2021 00:29    Medications: I have reviewed the patient's current medications. Prior to Admission:  Medications Prior to Admission  Medication Sig Dispense Refill Last Dose   acetaminophen (TYLENOL) 500 MG tablet Take 500 mg by mouth 2 (two) times daily.   09/27/2021 at am   ANTI-DIARRHEAL 2 MG tablet Take 1 tablet by mouth 4 (four) times daily as needed for diarrhea or loose stools.   09/27/2021 at 2000   ARIPiprazole (ABILIFY) 5 MG tablet Take 5 mg by mouth daily.   09/27/2021   buPROPion (WELLBUTRIN XL) 150 MG 24 hr tablet Take 150 mg by mouth every morning.   09/27/2021   levETIRAcetam (KEPPRA) 250 MG tablet Take 250 mg by mouth 2 (two) times daily.   09/27/2021 at pm   lovastatin (MEVACOR) 40 MG tablet Take 40 mg by mouth at bedtime.   09/27/2021   oxybutynin (DITROPAN) 5 MG tablet Take 5 mg by mouth 3 (three) times daily.   09/27/2021 at pm   pantoprazole (PROTONIX) 40 MG tablet Take 40 mg by mouth daily.   09/27/2021   potassium chloride SA (KLOR-CON M) 20 MEQ tablet Take 20 mEq by mouth 2 (two) times daily.   09/27/2021 at pm   sertraline (ZOLOFT) 100 MG tablet Take 150 mg by mouth daily.   09/27/2021   traZODone (DESYREL) 50 MG tablet Take 25 mg by mouth at bedtime.   09/27/2021   Scheduled:  ARIPiprazole  5 mg Oral Daily   buPROPion  150 mg Oral q morning   heparin  5,000 Units Subcutaneous Q8H   levETIRAcetam  250 mg Oral BID   oxybutynin  5 mg Oral TID   polyethylene  glycol-electrolytes  4,000 mL Oral Once   pravastatin  40 mg Oral q1800   sertraline  150 mg Oral Daily   Continuous:  cefTRIAXone (ROCEPHIN)  IV 1 g (09/29/21 0132)    Assessment/Plan: 1) Severe diarrhea for the last 4 to 5 weeks-C. difficile toxin assay and GI pathogen panel has been negative/8 lb weight loss in 1 week-proceed with a colonoscopy tomorrow.  Prep orders written.  I advised the family to avoid the use of all artificial sweeteners including Splenda Stevia  diet drinks etc. as this can cause a osmotic diarrhea as well. 2) COPD/chronic hypoxic respiratory failure on O2 at home. 3) Acute cystitis with hematuria improving on antibiotics. 4) Acute kidney injury secondary dehydration.  LOS: 0 days   Juanita Craver 09/29/2021, 3:38 PM

## 2021-09-29 NOTE — Progress Notes (Addendum)
PROGRESS NOTE    Michele Harper  OYD:[id number] DOB: 10/20/1935 DOA: 09/27/2021 PCP: Wayland Salinas, MD    Brief Narrative:  This 86 years old female with PMH significant for COPD, chronic hypoxic respiratory failure on 2 L of supplemental oxygen at baseline which she is not compliant with, prior history of breast cancer, history of small bowel obstruction presented to the ED with complaints of diarrhea for 4- 5 weeks, nausea and vomiting for about a week and confusion for last 48 hours.  PCP has referred her to GI  but has not been seen.Patient has lost about 8 pounds in 1 week.  Pertinent labs in the ED: UA positive for nitrites.  CT head unremarkable, CT abdomen pelvis no acute intra-abdominal process.   Patient is admitted for acute metabolic and cephalopathy due to UTI , started on IV antibiotics.  GI is consulted for chronic diarrhea.  Assessment & Plan:   Principal Problem:   Acute cystitis without hematuria Active Problems:   Chronic diarrhea   Dehydration   Chronic obstructive pulmonary disease, unspecified (HCC)   Chronic respiratory failure with hypoxia (HCC) - noncompliant with 2 L/min   Acute metabolic encephalopathy   Seizure disorder ([place])  Acute cystitis without hematuria: Patient presented with altered mentation, nausea , vomiting and diarrhea. UA consistent with UTI , Nitrites+, LE+, initiated on IV ceftriaxone. Continue IV antibiotics and follow-up cultures.  Acute kidney injury secondary to dehydration: Patient seems dehydrated, serum creatinine 1.06 Baseline creatinine 0.76,  Continue IV hydration.  Chronic diarrhea: Patient has diarrhea for 5 weeks.  Daughter reports Lomotil has not been effective. Patient has remote history of C. difficile.  She has been on antibiotic recently in the last 1 to 2 months for upper respiratory tract infection C. difficile negative, GI panel pending GI is consulted , given chronic diarrhea.  If GI panel and C. difficile  negative consider colonoscopy.  Chronic hypoxic respiratory failure: Continue supplemental oxygen , daughter reports noncompliance.  COPD.: Stable.  Chronic.  Not in exacerbation.   Continue home inhalers.  Seizure disorder: Continue Keppra 250 twice daily  Acute metabolic encephalopathy: Likely secondary to UTI and dehydration.  Continue IV hydration. Mental status significantly improved and back to baseline.  Hypokalemia/ Hypomagnesemia: Replaced.  Continue to monitor  DVT prophylaxis: Lovenox Code Status: Full code. Family Communication: No family at bed side. Disposition Plan:   Status is: Observation The patient remains OBS appropriate and will d/c before 2 midnights.   Admitted for chronic diarrhea, altered mentation due to UTI,  Mental status back to normal , continue IV antibiotics.   Patient may require colonoscopy  Consultants:  Allergy  Procedures: CT head CT abdomen and pelvis Antimicrobials: Ceftriaxone  Subjective: Patient was seen and examined at bedside.  Overnight events noted.   Patient appears back to her baseline mental status.   She still reports having diarrhea but reports urinary burning has improved.  Objective: Vitals:   09/28/21 2221 09/29/21 0500 09/29/21 0503 09/29/21 0900  BP: (!) 142/64  113/64 112/62  Pulse: 86  81 80  Resp: '17  17 16  '$ Temp: 98.8 F (37.1 C)  98.4 F (36.9 C) 98.1 F (36.7 C)  TempSrc: Oral  Oral Oral  SpO2: 95%  94% 95%  Weight:  49.4 kg    Height:        Intake/Output Summary (Last 24 hours) at 09/29/2021 1340 Last data filed at 09/29/2021 1000 Gross per 24 hour  Intake 248.81 ml  Output --  Net 248.81 ml   Filed Weights   09/28/21 0554 09/29/21 0500  Weight: 47.7 kg 49.4 kg    Examination:  General exam: Appears comfortable, not in any acute distress.  Deconditioned Respiratory system: CTA bilaterally, no wheezing, no crackles, normal respiratory effort. Cardiovascular system: S1 & S2 heard,  regular rate and rhythm, no murmur. Gastrointestinal system: Abdomen is soft, non tender, non distended, BS + Central nervous system: Alert and oriented X 3. No focal neurological deficits. Extremities: No edema, no cyanosis, no clubbing. Skin: No rashes, lesions or ulcers Psychiatry: Judgement and insight appear normal. Mood & affect appropriate.     Data Reviewed: I have personally reviewed following labs and imaging studies  CBC: Recent Labs  Lab 09/27/21 2323 09/29/21 0322  WBC 7.2 5.0  NEUTROABS 5.8 3.8  HGB 13.5 11.0*  HCT 42.1 34.5*  MCV 101.4* 101.2*  PLT 187 440   Basic Metabolic Panel: Recent Labs  Lab 09/27/21 2323 09/29/21 0543  NA 140 143  K 4.4 3.1*  CL 111 113*  CO2 20* 21*  GLUCOSE 91 92  BUN 14 10  CREATININE 1.06* 1.01*  CALCIUM 9.1 8.1*  MG  --  1.5*   GFR: Estimated Creatinine Clearance: 29 mL/min (A) (by C-G formula based on SCr of 1.01 mg/dL (H)). Liver Function Tests: Recent Labs  Lab 09/27/21 2323  AST 22  ALT 13  ALKPHOS 107  BILITOT 0.3  PROT 6.9  ALBUMIN 3.9   Recent Labs  Lab 09/27/21 2323  LIPASE 29   No results for input(s): "AMMONIA" in the last 168 hours. Coagulation Profile: No results for input(s): "INR", "PROTIME" in the last 168 hours. Cardiac Enzymes: No results for input(s): "CKTOTAL", "CKMB", "CKMBINDEX", "TROPONINI" in the last 168 hours. BNP (last 3 results) No results for input(s): "PROBNP" in the last 8760 hours. HbA1C: No results for input(s): "HGBA1C" in the last 72 hours. CBG: No results for input(s): "GLUCAP" in the last 168 hours. Lipid Profile: No results for input(s): "CHOL", "HDL", "LDLCALC", "TRIG", "CHOLHDL", "LDLDIRECT" in the last 72 hours. Thyroid Function Tests: No results for input(s): "TSH", "T4TOTAL", "FREET4", "T3FREE", "THYROIDAB" in the last 72 hours. Anemia Panel: No results for input(s): "VITAMINB12", "FOLATE", "FERRITIN", "TIBC", "IRON", "RETICCTPCT" in the last 72 hours. Sepsis  Labs: No results for input(s): "PROCALCITON", "LATICACIDVEN" in the last 168 hours.  Recent Results (from the past 240 hour(s))  Gastrointestinal Panel by PCR , Stool     Status: None   Collection Time: 09/27/21  5:01 PM   Specimen: STOOL  Result Value Ref Range Status   Campylobacter species NOT DETECTED NOT DETECTED Final   Plesimonas shigelloides NOT DETECTED NOT DETECTED Final   Salmonella species NOT DETECTED NOT DETECTED Final   Yersinia enterocolitica NOT DETECTED NOT DETECTED Final   Vibrio species NOT DETECTED NOT DETECTED Final   Vibrio cholerae NOT DETECTED NOT DETECTED Final   Enteroaggregative E coli (EAEC) NOT DETECTED NOT DETECTED Final   Enteropathogenic E coli (EPEC) NOT DETECTED NOT DETECTED Final   Enterotoxigenic E coli (ETEC) NOT DETECTED NOT DETECTED Final   Shiga like toxin producing E coli (STEC) NOT DETECTED NOT DETECTED Final   Shigella/Enteroinvasive E coli (EIEC) NOT DETECTED NOT DETECTED Final   Cryptosporidium NOT DETECTED NOT DETECTED Final   Cyclospora cayetanensis NOT DETECTED NOT DETECTED Final   Entamoeba histolytica NOT DETECTED NOT DETECTED Final   Giardia lamblia NOT DETECTED NOT DETECTED Final   Adenovirus F40/41 NOT DETECTED NOT DETECTED  Final   Astrovirus NOT DETECTED NOT DETECTED Final   Norovirus GI/GII NOT DETECTED NOT DETECTED Final   Rotavirus A NOT DETECTED NOT DETECTED Final   Sapovirus (I, II, IV, and V) NOT DETECTED NOT DETECTED Final    Comment: Performed at [facility], [place], [place] [address]  C Difficile Quick Screen w PCR reflex     Status: None   Collection Time: 09/27/21  5:01 PM   Specimen: STOOL  Result Value Ref Range Status   C Diff antigen NEGATIVE NEGATIVE Final   C Diff toxin NEGATIVE NEGATIVE Final   C Diff interpretation No C. difficile detected.  Final    Comment: Performed at [facility], [place] [address], [place], [place] [address]  Urine Culture     Status:  Abnormal (Preliminary result)   Collection Time: 09/27/21 11:48 PM   Specimen: Urine, Clean Catch  Result Value Ref Range Status   Specimen Description   Final    URINE, CLEAN CATCH Performed at [facility], [place] [address], [place], [place] [address]    Special Requests   Final    NONE Performed at [facility], [place] [address] [place], [place] [address]    Culture (A)  Final    >=100,000 COLONIES/mL PROTEUS MIRABILIS SUSCEPTIBILITIES TO FOLLOW Performed at [facility], [place] [address] [place],  [address]    Report Status PENDING  Incomplete         Radiology Studies: CT Head Wo Contrast  Result Date: 09/28/2021 CLINICAL DATA:  Altered mental status EXAM: CT HEAD WITHOUT CONTRAST TECHNIQUE: Contiguous axial images were obtained from the base of the skull through the vertex without intravenous contrast. RADIATION DOSE REDUCTION: This exam was performed according to the departmental dose-optimization program which includes automated exposure control, adjustment of the mA and/or kV according to patient size and/or use of iterative reconstruction technique. COMPARISON:  None Available. FINDINGS: Brain: There is no mass, hemorrhage or extra-axial collection. There is generalized atrophy without lobar predilection. Hypodensity of the white matter is most commonly associated with chronic microvascular disease. Old right basal ganglia small vessel infarct. Vascular: Atherosclerotic calcification of the internal carotid arteries at the skull base. No abnormal hyperdensity of the major intracranial arteries or dural venous sinuses. Skull: The visualized skull base, calvarium and extracranial soft tissues are normal. Sinuses/Orbits: No fluid levels or advanced mucosal thickening of the visualized paranasal sinuses. No mastoid or middle ear effusion. The orbits are normal. IMPRESSION: 1. No acute intracranial abnormality. 2. Chronic microvascular  ischemia and generalized atrophy. Electronically Signed   By: Ulyses Jarred M.D.   On: 09/28/2021 02:15   CT ABDOMEN PELVIS W CONTRAST  Result Date: 09/28/2021 CLINICAL DATA:  Nausea and vomiting EXAM: CT ABDOMEN AND PELVIS WITH CONTRAST TECHNIQUE: Multidetector CT imaging of the abdomen and pelvis was performed using the standard protocol following bolus administration of intravenous contrast. RADIATION DOSE REDUCTION: This exam was performed according to the departmental dose-optimization program which includes automated exposure control, adjustment of the mA and/or kV according to patient size and/or use of iterative reconstruction technique. CONTRAST:  65m OMNIPAQUE IOHEXOL 300 MG/ML  SOLN COMPARISON:  None Available. FINDINGS: Lower Chest: Normal. Hepatobiliary: Normal hepatic contours. Mild biliary dilatation with common bile duct measuring 10 mm. There is mild intrahepatic dilatation as well. There is cholelithiasis without acute inflammation. Pancreas: Pancreas appears normal. Pancreatic duct is at upper limits of normal. Spleen: Normal. Adrenals/Urinary Tract:  The adrenal glands are normal. No hydronephrosis, nephroureterolithiasis or solid renal mass. Left interpolar renal cyst measures 7.2 cm. This is Bosniak class 1 and no follow-up imaging is recommended. The urinary bladder is normal for degree of distention Stomach/Bowel: There is no hiatal hernia. Normal duodenal course and caliber. No small bowel dilatation or inflammation. No focal colonic abnormality. Normal appendix. Vascular/Lymphatic: There is calcific atherosclerosis of the abdominal aorta. No lymphadenopathy. Reproductive: Status post hysterectomy. No adnexal mass. Other: Status post hernia repair. Musculoskeletal: Bilateral hip arthroplasties. Chronic compression fracture of L3 with pars interarticularis defects and grade 1 anterolisthesis. IMPRESSION: 1. No acute abnormality of the abdomen or pelvis. 2. Mild intrahepatic and  extrahepatic biliary dilatation. 3. Chronic compression fracture of L3 with pars interarticularis defects and grade 1 anterolisthesis. 4. Aortic Atherosclerosis (ICD10-I70.0). Electronically Signed   By: Ulyses Jarred M.D.   On: 09/28/2021 02:08   DG Chest 1 View  Result Date: 09/28/2021 CLINICAL DATA:  Altered mental status.  Nausea and vomiting. EXAM: CHEST  1 VIEW COMPARISON:  Remote radiograph 11/06/2009 FINDINGS: The cardiomediastinal contours are normal. Diffuse thoracic aortic atherosclerosis. Mild bibasilar atelectasis. Pulmonary vasculature is normal. No consolidation, pleural effusion, or pneumothorax. No acute osseous abnormalities are seen. Probable left calcified breast lesion IMPRESSION: 1. Mild bibasilar atelectasis. 2.  Aortic Atherosclerosis (ICD10-I70.0). Electronically Signed   By: Keith Rake M.D.   On: 09/28/2021 00:29    Scheduled Meds:  ARIPiprazole  5 mg Oral Daily   buPROPion  150 mg Oral q morning   heparin  5,000 Units Subcutaneous Q8H   levETIRAcetam  250 mg Oral BID   oxybutynin  5 mg Oral TID   pravastatin  40 mg Oral q1800   sertraline  150 mg Oral Daily   Continuous Infusions:  cefTRIAXone (ROCEPHIN)  IV 1 g (09/29/21 0132)     LOS: 0 days    Time spent:45 mins    Shawna Clamp, MD [facility]   If 7PM-7AM, please contact night-coverage

## 2021-09-30 ENCOUNTER — Inpatient Hospital Stay (HOSPITAL_COMMUNITY): Payer: Medicare HMO | Admitting: Certified Registered Nurse Anesthetist

## 2021-09-30 ENCOUNTER — Encounter (HOSPITAL_COMMUNITY): Payer: Self-pay | Admitting: Internal Medicine

## 2021-09-30 ENCOUNTER — Encounter (HOSPITAL_COMMUNITY)
Admission: EM | Disposition: A | Discharge status: Home Health | Payer: Self-pay | Source: Home / Self Care | Attending: Family Medicine

## 2021-09-30 DIAGNOSIS — G9341 Metabolic encephalopathy: Secondary | ICD-10-CM | POA: Diagnosis present

## 2021-09-30 DIAGNOSIS — J449 Chronic obstructive pulmonary disease, unspecified: Secondary | ICD-10-CM | POA: Diagnosis not present

## 2021-09-30 DIAGNOSIS — Z9981 Dependence on supplemental oxygen: Secondary | ICD-10-CM | POA: Diagnosis not present

## 2021-09-30 DIAGNOSIS — B964 Proteus (mirabilis) (morganii) as the cause of diseases classified elsewhere: Secondary | ICD-10-CM | POA: Diagnosis present

## 2021-09-30 DIAGNOSIS — Z91199 Patient's noncompliance with other medical treatment and regimen due to unspecified reason: Secondary | ICD-10-CM | POA: Diagnosis not present

## 2021-09-30 DIAGNOSIS — Z853 Personal history of malignant neoplasm of breast: Secondary | ICD-10-CM | POA: Diagnosis not present

## 2021-09-30 DIAGNOSIS — G40909 Epilepsy, unspecified, not intractable, without status epilepticus: Secondary | ICD-10-CM | POA: Diagnosis present

## 2021-09-30 DIAGNOSIS — E860 Dehydration: Secondary | ICD-10-CM | POA: Diagnosis present

## 2021-09-30 DIAGNOSIS — K529 Noninfective gastroenteritis and colitis, unspecified: Secondary | ICD-10-CM | POA: Diagnosis present

## 2021-09-30 DIAGNOSIS — Z79899 Other long term (current) drug therapy: Secondary | ICD-10-CM | POA: Diagnosis not present

## 2021-09-30 DIAGNOSIS — Z87891 Personal history of nicotine dependence: Secondary | ICD-10-CM | POA: Diagnosis not present

## 2021-09-30 DIAGNOSIS — Z888 Allergy status to other drugs, medicaments and biological substances status: Secondary | ICD-10-CM | POA: Diagnosis not present

## 2021-09-30 DIAGNOSIS — I739 Peripheral vascular disease, unspecified: Secondary | ICD-10-CM | POA: Diagnosis present

## 2021-09-30 DIAGNOSIS — N3000 Acute cystitis without hematuria: Secondary | ICD-10-CM | POA: Diagnosis present

## 2021-09-30 DIAGNOSIS — K5730 Diverticulosis of large intestine without perforation or abscess without bleeding: Secondary | ICD-10-CM

## 2021-09-30 DIAGNOSIS — E876 Hypokalemia: Secondary | ICD-10-CM | POA: Diagnosis present

## 2021-09-30 DIAGNOSIS — U071 COVID-19: Secondary | ICD-10-CM | POA: Diagnosis not present

## 2021-09-30 DIAGNOSIS — Z880 Allergy status to penicillin: Secondary | ICD-10-CM | POA: Diagnosis not present

## 2021-09-30 DIAGNOSIS — J9611 Chronic respiratory failure with hypoxia: Secondary | ICD-10-CM | POA: Diagnosis present

## 2021-09-30 DIAGNOSIS — N179 Acute kidney failure, unspecified: Secondary | ICD-10-CM | POA: Diagnosis present

## 2021-09-30 DIAGNOSIS — R634 Abnormal weight loss: Secondary | ICD-10-CM | POA: Diagnosis present

## 2021-09-30 DIAGNOSIS — R197 Diarrhea, unspecified: Secondary | ICD-10-CM | POA: Diagnosis not present

## 2021-09-30 DIAGNOSIS — J9811 Atelectasis: Secondary | ICD-10-CM | POA: Diagnosis present

## 2021-09-30 HISTORY — PX: COLONOSCOPY WITH PROPOFOL: SHX5780

## 2021-09-30 HISTORY — PX: BIOPSY: SHX5522

## 2021-09-30 LAB — BASIC METABOLIC PANEL
Anion gap: 10 (ref 5–15)
BUN: 9 mg/dL (ref 8–23)
CO2: 17 mmol/L — ABNORMAL LOW (ref 22–32)
Calcium: 8.2 mg/dL — ABNORMAL LOW (ref 8.9–10.3)
Chloride: 115 mmol/L — ABNORMAL HIGH (ref 98–111)
Creatinine, Ser: 0.89 mg/dL (ref 0.44–1.00)
GFR, Estimated: >60 mL/min (ref >60)
Glucose, Bld: 88 mg/dL (ref 70–99)
Potassium: 3.3 mmol/L — ABNORMAL LOW (ref 3.5–5.1)
Sodium: 142 mmol/L (ref 135–145)

## 2021-09-30 LAB — CBC WITH DIFFERENTIAL/PLATELET
Abs Immature Granulocytes: 0.01 10*3/uL (ref 0.00–0.07)
Basophils Absolute: 0 10*3/uL (ref 0.0–0.1)
Basophils Relative: 1 %
Eosinophils Absolute: 0.1 10*3/uL (ref 0.0–0.5)
Eosinophils Relative: 3 %
HCT: 37.9 % (ref 36.0–46.0)
Hemoglobin: 11.4 g/dL — ABNORMAL LOW (ref 12.0–15.0)
Immature Granulocytes: 0 %
Lymphocytes Relative: 29 %
Lymphs Abs: 1.1 10*3/uL (ref 0.7–4.0)
MCH: 31.6 pg (ref 26.0–34.0)
MCHC: 30.1 g/dL (ref 30.0–36.0)
MCV: 105 fL — ABNORMAL HIGH (ref 80.0–100.0)
Monocytes Absolute: 0.4 10*3/uL (ref 0.1–1.0)
Monocytes Relative: 11 %
Neutro Abs: 2.2 10*3/uL (ref 1.7–7.7)
Neutrophils Relative %: 56 %
Platelets: 155 10*3/uL (ref 150–400)
RBC: 3.61 MIL/uL — ABNORMAL LOW (ref 3.87–5.11)
RDW: 16.1 % — ABNORMAL HIGH (ref 11.5–15.5)
WBC: 3.9 10*3/uL — ABNORMAL LOW (ref 4.0–10.5)
nRBC: 0 % (ref 0.0–0.2)

## 2021-09-30 LAB — URINE CULTURE: Culture: >=100000 — AB

## 2021-09-30 LAB — MAGNESIUM: Magnesium: 2.2 mg/dL (ref 1.7–2.4)

## 2021-09-30 SURGERY — COLONOSCOPY WITH PROPOFOL
Anesthesia: Monitor Anesthesia Care

## 2021-09-30 MED ORDER — PROPOFOL 500 MG/50ML IV EMUL
INTRAVENOUS | Status: DC | PRN
  Administered 2021-09-30: 120 ug/kg/min via INTRAVENOUS

## 2021-09-30 MED ORDER — SODIUM CHLORIDE 0.9 % IV SOLN: INTRAVENOUS | Status: DC

## 2021-09-30 MED ORDER — POTASSIUM CHLORIDE 20 MEQ PO PACK
40.0000 meq | PACK | Freq: Once | ORAL | Status: AC
  Administered 2021-09-30: 40 meq via ORAL
  Filled 2021-09-30: qty 2

## 2021-09-30 MED ORDER — PROPOFOL 10 MG/ML IV BOLUS
INTRAVENOUS | Status: DC | PRN
  Administered 2021-09-30 (×2): 10 mg via INTRAVENOUS

## 2021-09-30 MED ORDER — PROPOFOL 10 MG/ML IV BOLUS
INTRAVENOUS | Status: AC
  Filled 2021-09-30: qty 20

## 2021-09-30 MED ORDER — LACTATED RINGERS IV SOLN
INTRAVENOUS | Status: AC | PRN
  Administered 2021-09-30: 20 mL/h via INTRAVENOUS

## 2021-09-30 SURGICAL SUPPLY — 22 items

## 2021-09-30 NOTE — Anesthesia Preprocedure Evaluation (Addendum)
Anesthesia Evaluation  Patient identified by MRN, date of birth, ID band Patient awake    Reviewed: Allergy & Precautions, NPO status , Patient's Chart, lab work & pertinent test results  History of Anesthesia Complications Negative for: history of anesthetic complications  Airway Mallampati: II  TM Distance: >3 FB Neck ROM: Full    Dental no notable dental hx.    Pulmonary COPD, former smoker,    Pulmonary exam normal        Cardiovascular negative cardio ROS Normal cardiovascular exam     Neuro/Psych Seizures -,  negative psych ROS   GI/Hepatic Neg liver ROS, GERD  Medicated,diarrhea   Endo/Other  negative endocrine ROS  Renal/GU negative Renal ROS   UTI    Musculoskeletal negative musculoskeletal ROS (+)   Abdominal   Peds  Hematology  (+) Blood dyscrasia (Hgb 11.4), anemia ,   Anesthesia Other Findings Day of surgery medications reviewed with patient.  Reproductive/Obstetrics negative OB ROS                            Anesthesia Physical Anesthesia Plan  ASA: 3  Anesthesia Plan: MAC   Post-op Pain Management: Minimal or no pain anticipated   Induction:   PONV Risk Score and Plan: 2 and Treatment may vary due to age or medical condition and Propofol infusion  Airway Management Planned: Natural Airway and Simple Face Mask  Additional Equipment: None  Intra-op Plan:   Post-operative Plan:   Informed Consent: I have reviewed the patients History and Physical, chart, labs and discussed the procedure including the risks, benefits and alternatives for the proposed anesthesia with the patient or authorized representative who has indicated his/her understanding and acceptance.       Plan Discussed with: CRNA  Anesthesia Plan Comments:        Anesthesia Quick Evaluation

## 2021-09-30 NOTE — Anesthesia Postprocedure Evaluation (Signed)
Anesthesia Post Note  Patient: Michele Harper  Procedure(s) Performed: COLONOSCOPY WITH PROPOFOL BIOPSY     Patient location during evaluation: Endoscopy Anesthesia Type: MAC Level of consciousness: awake and alert, patient cooperative and oriented Pain management: pain level controlled Vital Signs Assessment: post-procedure vital signs reviewed and stable Respiratory status: nonlabored ventilation, respiratory function stable and spontaneous breathing Cardiovascular status: stable and blood pressure returned to baseline Postop Assessment: no apparent nausea or vomiting and able to ambulate Anesthetic complications: no   No notable events documented.  Last Vitals:  Vitals:   09/30/21 1440 09/30/21 1450  BP: 100/78 (!) 111/52  Pulse: 71 72  Resp: (!) 22 (!) 22  Temp:  36.8 C  SpO2: 96% 99%    Last Pain:  Vitals:   09/30/21 1450  TempSrc: Temporal  PainSc: 0-No pain                 Mitsuo Budnick,E. Berenis Corter

## 2021-09-30 NOTE — Op Note (Addendum)
[facility] Patient Name: Michele Harper Procedure Date: 09/30/2021 MRN: [id number] Attending MD: Carol Ada , MD Date of Birth: 20-May-1935 CSN: [id number] Age: 86 Admit Type: Outpatient Procedure:                Colonoscopy Indications:              Chronic diarrhea Providers:                Carol Ada, MD, Burtis Junes, RN, Darliss Cheney,                            Technician Referring MD:              Medicines:                Propofol per Anesthesia Complications:            No immediate complications. Estimated Blood Loss:     Estimated blood loss: none. Procedure:                Pre-Anesthesia Assessment:                           - Prior to the procedure, a History and Physical                            was performed, and patient medications and                            allergies were reviewed. The patient's tolerance of                            previous anesthesia was also reviewed. The risks                            and benefits of the procedure and the sedation                            options and risks were discussed with the patient.                            All questions were answered, and informed consent                            was obtained. Prior Anticoagulants: The patient has                            taken no previous anticoagulant or antiplatelet                            agents. ASA Grade Assessment: III - A patient with                            severe systemic disease. After reviewing the risks                            and benefits, the  patient was deemed in                            satisfactory condition to undergo the procedure.                           - Sedation was administered by an anesthesia                            professional. Deep sedation was attained.                           After obtaining informed consent, the colonoscope                            was passed under direct vision. Throughout the                             procedure, the patient's blood pressure, pulse, and                            oxygen saturations were monitored continuously. The                            CF-HQ190L ([id number]) Olympus colonoscope was                            introduced through the anus and advanced to the the                            cecum, identified by appendiceal orifice and                            ileocecal valve. The colonoscopy was performed with                            difficulty due to a redundant colon and significant                            looping. Successful completion of the procedure was                            aided by using manual pressure and straightening                            and shortening the scope to obtain bowel loop                            reduction. The patient tolerated the procedure                            well. The quality of the bowel preparation was  evaluated using the BBPS [facility] Bowel Preparation                            Scale) with scores of: Right Colon = 3 (entire                            mucosa seen well with no residual staining, small                            fragments of stool or opaque liquid), Transverse                            Colon = 3 (entire mucosa seen well with no residual                            staining, small fragments of stool or opaque                            liquid) and Left Colon = 3 (entire mucosa seen well                            with no residual staining, small fragments of stool                            or opaque liquid). The total BBPS score equals 9.                            The quality of the bowel preparation was good. The                            [id number]-H190TL ([id number]) Olympus slim colonoscope was                            introduced through the and advanced to the. Scope In: 1:45:40 PM Scope Out: 2:22:26 PM Scope Withdrawal Time: 0 hours 9 minutes 42 seconds  Total  Procedure Duration: 0 hours 36 minutes 46 seconds  Findings:      Normal mucosa was found in the entire colon. Biopsies for histology were       taken with a cold forceps from the ascending colon and descending colon       for evaluation of microscopic colitis.      Scattered small and large-mouthed diverticula were found in the sigmoid       colon and descending colon.      The adult colonoscope was exchanged for an ultraslim colonoscope.       Significant looping occurred. With external pressure, frequent scope       reductions, and a water infusion technique the colonoscope was able to       be advanced to the cecum. Even with these precautions to prevent       intraluminal pressure, a mild to moderate barotrauma was noted in the       proximal colon. Impression:               - Normal mucosa  in the entire examined colon.                            Biopsied.                           - Diverticulosis in the sigmoid colon and in the                            descending colon. Moderate Sedation:      Not Applicable - Patient had care per Anesthesia. Recommendation:           - Return patient to hospital ward for ongoing care.                           - Resume regular diet.                           - Continue present medications.                           - Await pathology results.                           - Symptomatic control with anti-diarrheals. Procedure Code(s):        --- Professional ---                           [phone], Colonoscopy, flexible; with biopsy, single                            or multiple Diagnosis Code(s):        --- Professional ---                           K52.9, Noninfective gastroenteritis and colitis,                            unspecified                           K57.30, Diverticulosis of large intestine without                            perforation or abscess without bleeding CPT copyright 2019 [employer] Medical Association. All rights reserved. The  codes documented in this report are preliminary and upon coder review may  be revised to meet current compliance requirements. Carol Ada, MD Carol Ada, MD 09/30/2021 2:32:41 PM This report has been signed electronically. Number of Addenda: 0

## 2021-09-30 NOTE — Transfer of Care (Signed)
Immediate Anesthesia Transfer of Care Note  Patient: Michele Harper  Procedure(s) Performed: COLONOSCOPY WITH PROPOFOL BIOPSY  Patient Location: PACU  Anesthesia Type:MAC  Level of Consciousness: drowsy  Airway & Oxygen Therapy: Patient Spontanous Breathing and Patient connected to face mask oxygen  Post-op Assessment: Report given to RN, Post -op Vital signs reviewed and stable and Patient moving all extremities X 4  Post vital signs: Reviewed and stable  Last Vitals:  Vitals Value Taken Time  BP 91/43   Temp    Pulse 62 09/30/21 1427  Resp 20 09/30/21 1427  SpO2 99 % 09/30/21 1427  Vitals shown include unvalidated device data.  Last Pain:  Vitals:   09/30/21 1257  TempSrc: Temporal  PainSc: 0-No pain      Patients Stated Pain Goal: 2 (27/78/24 2353)  Complications: No notable events documented.

## 2021-09-30 NOTE — Plan of Care (Signed)

## 2021-09-30 NOTE — Progress Notes (Signed)
PROGRESS NOTE    Michele Harper  EYC:[id number] DOB: [birth year] DOA: 09/27/2021 PCP: Wayland Salinas, MD    Brief Narrative:  This 86 years old female with PMH significant for COPD, chronic hypoxic respiratory failure on 2 L of supplemental oxygen at baseline which she is not compliant with, prior history of breast cancer, history of small bowel obstruction presented to the ED with complaints of diarrhea for 4- 5 weeks, nausea and vomiting for about a week and confusion for last 48 hours.  PCP has referred her to GI  but has not been seen. Patient has lost about 8 pounds in 1 week.  Pertinent labs in the ED: UA positive for nitrites.  CT head unremarkable, CT abdomen pelvis no acute intra-abdominal process.   Patient is admitted for acute metabolic encephalopathy due to UTI , started on IV antibiotics.  GI is consulted for chronic diarrhea.  Patient is scheduled for colonoscopy today.  Assessment & Plan:   Principal Problem:   Acute cystitis without hematuria Active Problems:   Chronic diarrhea   Dehydration   Chronic obstructive pulmonary disease, unspecified (HCC)   Chronic respiratory failure with hypoxia (HCC) - noncompliant with 2 L/min   Acute metabolic encephalopathy   Seizure disorder ([place])  Acute cystitis without hematuria / Proteus UTI: Patient presented with altered mentation, nausea , vomiting and diarrhea. UA consistent with UTI , Nitrites+, LE+, initiated on IV ceftriaxone. Urine cultures grew Proteus mirabilis sensitive to ceftriaxone. Continue ceftriaxone for 5 to 7 days.  Hematuria improved. Change to oral antibiotics once medically cleared for discharge.  Acute kidney injury secondary to dehydration: > Resolved. Patient seems dehydrated, serum creatinine 1.06 on arrival Baseline creatinine 0.76,  Continue IV hydration. Functions back to normal.  AKI resolved.  Chronic diarrhea: Patient has diarrhea for 5 weeks.  Daughter reports Lomotil has not been  effective. Patient has remote history of C. difficile.  She has been on antibiotics recently in the last 1 to 2 months for upper respiratory tract infection.  C. difficile negative, GI panel negative GI is consulted , given chronic diarrhea.  Patient is scheduled for colonoscopy today.  Chronic hypoxic respiratory failure: Continue supplemental oxygen , daughter reports noncompliance. Encouraged to use oxygen regularly.  COPD.: Stable.  Chronic.  Not in exacerbation.   Continue home inhalers.  Seizure disorder: Continue Keppra 250 twice daily  Acute metabolic encephalopathy: Likely secondary to UTI and dehydration.  Continue IV hydration. Mental status significantly improved and back to baseline.  Hypokalemia/ Hypomagnesemia: Replaced.  Continue to monitor  DVT prophylaxis: Lovenox Code Status: Full code. Family Communication: No family at bed side. Disposition Plan:   Status is: Inpatient Remains inpatient appropriate because:     Admitted for chronic diarrhea, altered mentation due to UTI,  Mental status back to normal , continue IV antibiotics.   Patient is scheduled for colonoscopy today.  Anticipated discharge home tomorrow if colonoscopy is normal If No further GI plans.  Consultants:  Allergy  Procedures: CT head CT abdomen and pelvis Antimicrobials: Ceftriaxone  Subjective: Patient was seen and examined at bedside.  Overnight events noted.   Patient appears back to baseline mental status.  Appears pleasant,  very interactive. She still reports having diarrhea , reports urinary burning has improved.  Objective: Vitals:   09/29/21 2116 09/30/21 0624 09/30/21 0918 09/30/21 1257  BP: (!) 132/52 (!) 104/50 (!) 126/93 (!) 127/54  Pulse: 77 70 73 74  Resp: '17 17 17 18  '$ Temp: 97.8 F (  36.6 C) 98.7 F (37.1 C) (!) 97.5 F (36.4 C) 98.2 F (36.8 C)  TempSrc:   Oral Temporal  SpO2: 100% 100% 100% 99%  Weight:      Height:        Intake/Output Summary  (Last 24 hours) at 09/30/2021 1351 Last data filed at 09/30/2021 1241 Gross per 24 hour  Intake 388.3 ml  Output --  Net 388.3 ml   Filed Weights   09/28/21 0554 09/29/21 0500  Weight: 47.7 kg 49.4 kg    Examination:  General exam: Appears comfortable, not in any acute distress.  Deconditioned Respiratory system: CTA bilaterally, no wheezing, no crackles, normal respiratory effort. Cardiovascular system: S1 & S2 heard, regular rate and rhythm, no murmur. Gastrointestinal system: Abdomen is soft, non tender, non distended, BS + Central nervous system: Alert and oriented X 3. No focal neurological deficits. Extremities: No edema, no cyanosis, no clubbing. Skin: No rashes, lesions or ulcers Psychiatry: Judgement and insight appear normal. Mood & affect appropriate.     Data Reviewed: I have personally reviewed following labs and imaging studies  CBC: Recent Labs  Lab 09/27/21 2323 09/29/21 0322 09/30/21 0250  WBC 7.2 5.0 3.9*  NEUTROABS 5.8 3.8 2.2  HGB 13.5 11.0* 11.4*  HCT 42.1 34.5* 37.9  MCV 101.4* 101.2* 105.0*  PLT 187 161 409   Basic Metabolic Panel: Recent Labs  Lab 09/27/21 2323 09/29/21 0543 09/30/21 0250  NA 140 143 142  K 4.4 3.1* 3.3*  CL 111 113* 115*  CO2 20* 21* 17*  GLUCOSE 91 92 88  BUN '14 10 9  '$ CREATININE 1.06* 1.01* 0.89  CALCIUM 9.1 8.1* 8.2*  MG  --  1.5* 2.2   GFR: Estimated Creatinine Clearance: 32.9 mL/min (by C-G formula based on SCr of 0.89 mg/dL). Liver Function Tests: Recent Labs  Lab 09/27/21 2323  AST 22  ALT 13  ALKPHOS 107  BILITOT 0.3  PROT 6.9  ALBUMIN 3.9   Recent Labs  Lab 09/27/21 2323  LIPASE 29   No results for input(s): "AMMONIA" in the last 168 hours. Coagulation Profile: No results for input(s): "INR", "PROTIME" in the last 168 hours. Cardiac Enzymes: No results for input(s): "CKTOTAL", "CKMB", "CKMBINDEX", "TROPONINI" in the last 168 hours. BNP (last 3 results) No results for input(s): "PROBNP" in  the last 8760 hours. HbA1C: No results for input(s): "HGBA1C" in the last 72 hours. CBG: No results for input(s): "GLUCAP" in the last 168 hours. Lipid Profile: No results for input(s): "CHOL", "HDL", "LDLCALC", "TRIG", "CHOLHDL", "LDLDIRECT" in the last 72 hours. Thyroid Function Tests: No results for input(s): "TSH", "T4TOTAL", "FREET4", "T3FREE", "THYROIDAB" in the last 72 hours. Anemia Panel: No results for input(s): "VITAMINB12", "FOLATE", "FERRITIN", "TIBC", "IRON", "RETICCTPCT" in the last 72 hours. Sepsis Labs: No results for input(s): "PROCALCITON", "LATICACIDVEN" in the last 168 hours.  Recent Results (from the past 240 hour(s))  Gastrointestinal Panel by PCR , Stool     Status: None   Collection Time: 09/27/21  5:01 PM   Specimen: STOOL  Result Value Ref Range Status   Campylobacter species NOT DETECTED NOT DETECTED Final   Plesimonas shigelloides NOT DETECTED NOT DETECTED Final   Salmonella species NOT DETECTED NOT DETECTED Final   Yersinia enterocolitica NOT DETECTED NOT DETECTED Final   Vibrio species NOT DETECTED NOT DETECTED Final   Vibrio cholerae NOT DETECTED NOT DETECTED Final   Enteroaggregative E coli (EAEC) NOT DETECTED NOT DETECTED Final   Enteropathogenic E coli (EPEC) NOT DETECTED  NOT DETECTED Final   Enterotoxigenic E coli (ETEC) NOT DETECTED NOT DETECTED Final   Shiga like toxin producing E coli (STEC) NOT DETECTED NOT DETECTED Final   Shigella/Enteroinvasive E coli (EIEC) NOT DETECTED NOT DETECTED Final   Cryptosporidium NOT DETECTED NOT DETECTED Final   Cyclospora cayetanensis NOT DETECTED NOT DETECTED Final   Entamoeba histolytica NOT DETECTED NOT DETECTED Final   Giardia lamblia NOT DETECTED NOT DETECTED Final   Adenovirus F40/41 NOT DETECTED NOT DETECTED Final   Astrovirus NOT DETECTED NOT DETECTED Final   Norovirus GI/GII NOT DETECTED NOT DETECTED Final   Rotavirus A NOT DETECTED NOT DETECTED Final   Sapovirus (I, II, IV, and V) NOT DETECTED NOT  DETECTED Final    Comment: Performed at [facility], [address] [place], [place] [address]  C Difficile Quick Screen w PCR reflex     Status: None   Collection Time: 09/27/21  5:01 PM   Specimen: STOOL  Result Value Ref Range Status   C Diff antigen NEGATIVE NEGATIVE Final   C Diff toxin NEGATIVE NEGATIVE Final   C Diff interpretation No C. difficile detected.  Final    Comment: Performed at [facility], [place] [address] [place], [place] [address]  Urine Culture     Status: Abnormal   Collection Time: 09/27/21 11:48 PM   Specimen: Urine, Clean Catch  Result Value Ref Range Status   Specimen Description   Final    URINE, CLEAN CATCH Performed at [facility], [place] [address], [place], [place] [address]    Special Requests   Final    NONE Performed at [facility], [place] [address] [place], [place] [address]    Culture >=100,000 COLONIES/mL PROTEUS MIRABILIS (A)  Final   Report Status 09/30/2021 FINAL  Final   Organism ID, Bacteria PROTEUS MIRABILIS (A)  Final      Susceptibility   Proteus mirabilis - MIC*    AMPICILLIN <=2 SENSITIVE Sensitive     CEFAZOLIN <=4 SENSITIVE Sensitive     CEFEPIME 0.5 SENSITIVE Sensitive     CEFTRIAXONE <=0.25 SENSITIVE Sensitive     CIPROFLOXACIN <=0.25 SENSITIVE Sensitive     GENTAMICIN <=1 SENSITIVE Sensitive     IMIPENEM 2 SENSITIVE Sensitive     NITROFURANTOIN 128 RESISTANT Resistant     TRIMETH/SULFA <=20 SENSITIVE Sensitive     AMPICILLIN/SULBACTAM <=2 SENSITIVE Sensitive     PIP/TAZO <=4 SENSITIVE Sensitive     * >=100,000 COLONIES/mL PROTEUS MIRABILIS    Radiology Studies: No results found.  Scheduled Meds:  [MAR Hold] ARIPiprazole  5 mg Oral Daily   [MAR Hold] buPROPion  150 mg Oral q morning   [MAR Hold] heparin  5,000 Units Subcutaneous Q8H   [MAR Hold] levETIRAcetam  250 mg Oral BID   [MAR Hold] oxybutynin  5 mg Oral TID   [MAR Hold] potassium chloride  40  mEq Oral Once   [MAR Hold] pravastatin  40 mg Oral q1800   [MAR Hold] sertraline  150 mg Oral Daily   Continuous Infusions:  sodium chloride     sodium chloride 100 mL/hr at 09/30/21 1241   [MAR Hold] cefTRIAXone (ROCEPHIN)  IV Stopped (09/30/21 0206)   lactated ringers 20 mL/hr (09/30/21 1305)     LOS: 0 days    Time spent:35 mins    Shawna Clamp, MD [facility]   If 7PM-7AM, please contact night-coverage

## 2021-09-30 NOTE — Interval H&P Note (Signed)
History and Physical Interval Note:  09/30/2021 1:35 PM  Michele Harper  has presented today for surgery, with the diagnosis of Severe diarrhea; GI pathogen panel negative.  The various methods of treatment have been discussed with the patient and family. After consideration of risks, benefits and other options for treatment, the patient has consented to  Procedure(s) with comments: COLONOSCOPY WITH PROPOFOL (N/A) - Severe uncontrollable diarrhea as a surgical intervention.  The patient's history has been reviewed, patient examined, no change in status, stable for surgery.  I have reviewed the patient's chart and labs.  Questions were answered to the patient's satisfaction.     Dewie Ahart D

## 2021-09-30 NOTE — Anesthesia Procedure Notes (Signed)
Procedure Name: MAC Date/Time: 09/30/2021 1:40 PM  Performed by: Niel Hummer, CRNAPre-anesthesia Checklist: Patient identified, Emergency Drugs available, Suction available and Patient being monitored Oxygen Delivery Method: Simple face mask

## 2021-09-30 NOTE — Plan of Care (Signed)

## 2021-09-30 NOTE — Progress Notes (Signed)
Patient's fecal output is light brown, clear and liquid with very slight sediment.

## 2021-10-01 ENCOUNTER — Encounter (HOSPITAL_COMMUNITY): Payer: Self-pay | Admitting: Gastroenterology

## 2021-10-01 DIAGNOSIS — N3000 Acute cystitis without hematuria: Secondary | ICD-10-CM | POA: Diagnosis not present

## 2021-10-01 LAB — BASIC METABOLIC PANEL
Anion gap: 6 (ref 5–15)
BUN: 8 mg/dL (ref 8–23)
CO2: 19 mmol/L — ABNORMAL LOW (ref 22–32)
Calcium: 7.6 mg/dL — ABNORMAL LOW (ref 8.9–10.3)
Chloride: 120 mmol/L — ABNORMAL HIGH (ref 98–111)
Creatinine, Ser: 0.67 mg/dL (ref 0.44–1.00)
GFR, Estimated: >60 mL/min (ref >60)
Glucose, Bld: 103 mg/dL — ABNORMAL HIGH (ref 70–99)
Potassium: 3.2 mmol/L — ABNORMAL LOW (ref 3.5–5.1)
Sodium: 145 mmol/L (ref 135–145)

## 2021-10-01 LAB — CBC WITH DIFFERENTIAL/PLATELET
Abs Immature Granulocytes: 0.01 10*3/uL (ref 0.00–0.07)
Basophils Absolute: 0 10*3/uL (ref 0.0–0.1)
Basophils Relative: 0 %
Eosinophils Absolute: 0.1 10*3/uL (ref 0.0–0.5)
Eosinophils Relative: 4 %
HCT: 32.1 % — ABNORMAL LOW (ref 36.0–46.0)
Hemoglobin: 10.2 g/dL — ABNORMAL LOW (ref 12.0–15.0)
Immature Granulocytes: 0 %
Lymphocytes Relative: 23 %
Lymphs Abs: 0.9 10*3/uL (ref 0.7–4.0)
MCH: 32.8 pg (ref 26.0–34.0)
MCHC: 31.8 g/dL (ref 30.0–36.0)
MCV: 103.2 fL — ABNORMAL HIGH (ref 80.0–100.0)
Monocytes Absolute: 0.4 10*3/uL (ref 0.1–1.0)
Monocytes Relative: 11 %
Neutro Abs: 2.3 10*3/uL (ref 1.7–7.7)
Neutrophils Relative %: 62 %
Platelets: 136 10*3/uL — ABNORMAL LOW (ref 150–400)
RBC: 3.11 MIL/uL — ABNORMAL LOW (ref 3.87–5.11)
RDW: 16.3 % — ABNORMAL HIGH (ref 11.5–15.5)
WBC: 3.7 10*3/uL — ABNORMAL LOW (ref 4.0–10.5)
nRBC: 0 % (ref 0.0–0.2)

## 2021-10-01 LAB — SARS CORONAVIRUS 2 BY RT PCR: SARS Coronavirus 2 by RT PCR: POSITIVE — AB

## 2021-10-01 LAB — PHOSPHORUS: Phosphorus: 2.2 mg/dL — ABNORMAL LOW (ref 2.5–4.6)

## 2021-10-01 LAB — MAGNESIUM: Magnesium: 1.8 mg/dL (ref 1.7–2.4)

## 2021-10-01 LAB — SURGICAL PATHOLOGY

## 2021-10-01 MED ORDER — DIPHENOXYLATE-ATROPINE 2.5-0.025 MG PO TABS
1.0000 | ORAL_TABLET | Freq: Three times a day (TID) | ORAL | Status: DC
  Administered 2021-10-01 – 2021-10-02 (×2): 1 via ORAL
  Filled 2021-10-01 (×2): qty 1

## 2021-10-01 MED ORDER — POTASSIUM PHOSPHATES 15 MMOLE/5ML IV SOLN
30.0000 mmol | Freq: Once | INTRAVENOUS | Status: AC
  Administered 2021-10-01: 30 mmol via INTRAVENOUS
  Filled 2021-10-01: qty 10

## 2021-10-01 MED ORDER — CEPHALEXIN 500 MG PO CAPS
500.0000 mg | ORAL_CAPSULE | Freq: Two times a day (BID) | ORAL | Status: DC
  Administered 2021-10-01 – 2021-10-04 (×6): 500 mg via ORAL
  Filled 2021-10-01 (×7): qty 1

## 2021-10-01 MED ORDER — ORAL CARE MOUTH RINSE: 15.0000 mL | OROMUCOSAL | Status: DC | PRN

## 2021-10-01 NOTE — Progress Notes (Signed)
Physical Therapy Treatment Patient Details Name: Michele Harper MRN: [id number] DOB: 1935-12-06 Today's Date: 10/01/2021   History of Present Illness Pt is an 86 year old female presenting to ED with diarrhea.  Pt admitted 09/27/21 for Acute cystitis without hematuria and dehydration. PMHx: COPD, PVD, breast cancer    PT Comments    General Comments: AxO x 3 very pleasant and knowledgeable. Assisted OOB to [facility] then amb in hallway went well.   General bed mobility comments: self able for most part with slight assist to complete scooting as pt's butt was in a hole. General transfer comment: good safety cognition and use of hands to steady self.  Also assisted on/off BSC with Supervision/MinGuard Assist.  No diarrhea this session. General Gait Details: pt tolerated amb a functional distance of 25 feet + 20 feet after a brief seated rest break.. Pt remained on 2 lts oxygen with sats 98% and 1/4 dyspnea.  Pt appears at prior mobility level. Pt plans to D/C back home when GI clears her.    Recommendations for follow up therapy are one component of a multi-disciplinary discharge planning process, led by the attending physician.  Recommendations may be updated based on patient status, additional functional criteria and insurance authorization.  Follow Up Recommendations  Home health PT (resume)     Assistance Recommended at Discharge Intermittent Supervision/Assistance  Patient can return home with the following A little help with walking and/or transfers;A little help with bathing/dressing/bathroom;Assist for transportation;Help with stairs or ramp for entrance;Assistance with cooking/housework   Equipment Recommendations  None recommended by PT    Recommendations for Other Services       Precautions / Restrictions Precautions Precautions: Fall Precaution Comments: 2 lts home O2 Restrictions Weight Bearing Restrictions: No     Mobility  Bed Mobility Overal bed mobility: Needs  Assistance Bed Mobility: Supine to Sit     Supine to sit: Min guard, Min assist     General bed mobility comments: self able for most part with slight assist to complete scooting as pt's butt was in a hole.    Transfers Overall transfer level: Needs assistance Equipment used: Rolling walker (2 wheels) Transfers: Sit to/from Stand, Bed to chair/wheelchair/BSC Sit to Stand: Supervision, Min guard Stand pivot transfers: Supervision, Min guard         General transfer comment: good safety cognition and use of hands to steady self.  Also assisted on/off BSC with Supervision/MinGuard Assist.  No diarrhea this session.    Ambulation/Gait Ambulation/Gait assistance: Supervision, Min guard Gait Distance (Feet): 45 Feet (25 feet then another 20 feet after a seated rest break) Assistive device: Rolling walker (2 wheels) Gait Pattern/deviations: Step-through pattern, Decreased stride length Gait velocity: decreased     General Gait Details: pt tolerated amb a functional distance of 25 feet + 20 feet after a brief seated rest break.. Pt remained on 2 lts oxygen with sats 98% and 1/4 dyspnea.  Pt appears at prior mobility level.   Stairs             Wheelchair Mobility    Modified Rankin (Stroke Patients Only)       Balance                                            Cognition Arousal/Alertness: Awake/alert Behavior During Therapy: WFL for tasks assessed/performed Overall Cognitive Status: Within Functional  Limits for tasks assessed                                 General Comments: AxO x 3 very pleasant and knowledgable        Exercises      General Comments        Pertinent Vitals/Pain Pain Assessment Pain Assessment: No/denies pain    Home Living                          Prior Function            PT Goals (current goals can now be found in the care plan section) Progress towards PT goals: Progressing toward  goals    Frequency    Min 3X/week      PT Plan Current plan remains appropriate    Co-evaluation              AM-PAC PT "6 Clicks" Mobility   Outcome Measure  Help needed turning from your back to your side while in a flat bed without using bedrails?: A Little Help needed moving from lying on your back to sitting on the side of a flat bed without using bedrails?: A Little Help needed moving to and from a bed to a chair (including a wheelchair)?: A Little Help needed standing up from a chair using your arms (e.g., wheelchair or bedside chair)?: A Little Help needed to walk in hospital room?: A Little Help needed climbing 3-5 steps with a railing? : A Little 6 Click Score: 18    End of Session Equipment Utilized During Treatment: Gait belt;Oxygen Activity Tolerance: Patient tolerated treatment well Patient left: in chair;with call bell/phone within reach;with chair alarm set Nurse Communication: Mobility status PT Visit Diagnosis: Difficulty in walking, not elsewhere classified (R26.2);Muscle weakness (generalized) (M62.81)     Time: 9163-8466 PT Time Calculation (min) (ACUTE ONLY): 25 min  Charges:  $Gait Training: 8-22 mins $Therapeutic Activity: 8-22 mins                     {Edythe Riches  PTA Acute  Rehabilitation Services Office M-F          [phone] Weekend pager [phone]

## 2021-10-01 NOTE — Progress Notes (Signed)
Subjective: Still with diarrhea.  Objective: Vital signs in last 24 hours: Temp:  [97.6 F (36.4 C)-98.7 F (37.1 C)] 97.9 F (36.6 C) (08/11 1356) Pulse Rate:  [72-81] 77 (08/11 1356) Resp:  [17-22] 18 (08/11 1356) BP: (110-123)/(52-81) 110/81 (08/11 1356) SpO2:  [95 %-100 %] 95 % (08/11 1356) Weight:  [48.2 kg] 48.2 kg (08/11 0500) Last BM Date : 09/30/21  Intake/Output from previous day: 08/10 0701 - 08/11 0700 In: 2595.9 [P.O.:340; I.V.:2155.9; IV Piggyback:100] Out: -  Intake/Output this shift: Total I/O In: 507.9 [I.V.:507.9] Out: -   General appearance: alert and no distress GI: soft, non-tender; bowel sounds normal; no masses,  no organomegaly  Lab Results: Recent Labs    09/29/21 0322 09/30/21 0250 10/01/21 0304  WBC 5.0 3.9* 3.7*  HGB 11.0* 11.4* 10.2*  HCT 34.5* 37.9 32.1*  PLT 161 155 136*   BMET Recent Labs    09/29/21 0543 09/30/21 0250 10/01/21 0304  NA 143 142 145  K 3.1* 3.3* 3.2*  CL 113* 115* 120*  CO2 21* 17* 19*  GLUCOSE 92 88 103*  BUN '10 9 8  '$ CREATININE 1.01* 0.89 0.67  CALCIUM 8.1* 8.2* 7.6*   LFT No results for input(s): "PROT", "ALBUMIN", "AST", "ALT", "ALKPHOS", "BILITOT", "BILIDIR", "IBILI" in the last 72 hours. PT/INR No results for input(s): "LABPROT", "INR" in the last 72 hours. Hepatitis Panel No results for input(s): "HEPBSAG", "HCVAB", "HEPAIGM", "HEPBIGM" in the last 72 hours. C-Diff No results for input(s): "CDIFFTOX" in the last 72 hours. Fecal Lactopherrin No results for input(s): "FECLLACTOFRN" in the last 72 hours.  Studies/Results: No results found.  Medications: Scheduled:  ARIPiprazole  5 mg Oral Daily   buPROPion  150 mg Oral q morning   cephALEXin  500 mg Oral Q12H   diphenoxylate-atropine  1 tablet Oral TID AC   heparin  5,000 Units Subcutaneous Q8H   levETIRAcetam  250 mg Oral BID   oxybutynin  5 mg Oral TID   pravastatin  40 mg Oral q1800   sertraline  150 mg Oral Daily   Continuous:   sodium chloride Stopped (10/01/21 1231)   potassium PHOSPHATE IVPB (in mmol) 30 mmol (10/01/21 1403)    Assessment/Plan: 1) Diarrhea. 2) COPD.   The patient continues to have diarrhea, but she does report that it is less in volume.  She will be tried on scheduled Lomotil 1 table TID to start.  Plan: 1) Lomotil TID. 2) If this does not control her symptoms, then cholestyramine can be added to her treatment.  LOS: 1 day   Marzetta Lanza D 10/01/2021, 2:45 PM

## 2021-10-01 NOTE — Progress Notes (Signed)
PROGRESS NOTE    Michele Harper  DEY:[id number] DOB: 04/28/1935 DOA: 09/27/2021 PCP: Wayland Salinas, MD    Brief Narrative:  This 86 years old female with PMH significant for COPD, chronic hypoxic respiratory failure on 2 L of supplemental oxygen at baseline which she is not compliant with, prior history of breast cancer, history of small bowel obstruction presented to the ED with complaints of diarrhea for 4- 5 weeks, nausea and vomiting for about a week and confusion for last 48 hours.  PCP has referred her to GI  but has not been seen. Patient has lost about 8 pounds in 1 week.  Pertinent labs in the ED: UA positive for nitrites.  CT head unremarkable, CT abdomen pelvis no acute intra-abdominal process.   Patient is admitted for acute metabolic encephalopathy due to UTI , started on IV antibiotics.  GI is consulted for chronic diarrhea.  Patient underwent colonoscopy found to have multiple diverticuli in sigmoid colon.  Assessment & Plan:   Principal Problem:   Acute cystitis without hematuria Active Problems:   Chronic diarrhea   Dehydration   Chronic obstructive pulmonary disease, unspecified (HCC)   Chronic respiratory failure with hypoxia (HCC) - noncompliant with 2 L/min   Acute metabolic encephalopathy   Seizure disorder ([place])  Acute cystitis without hematuria / Proteus UTI: Patient presented with altered mentation, nausea , vomiting and diarrhea. UA consistent with UTI , Nitrites+, LE+, initiated on IV ceftriaxone. Urine cultures grew Proteus mirabilis sensitive to ceftriaxone. Antibiotic changed to Keflex for 7 doses  ( Total 7 days)  Acute kidney injury secondary to dehydration: > Resolved. Patient seems dehydrated, serum creatinine 1.06 on arrival Baseline creatinine 0.76,  Continue IV hydration. Renal functions back to normal.  AKI resolved.  Chronic diarrhea: Patient has diarrhea for 5 weeks.  Daughter reports Lomotil has not been effective. Patient has remote  history of C. difficile.   She has been on antibiotics recently in the last 1 to 2 months for upper respiratory tract infection.   C. difficile negative, GI panel negative GI is consulted , given chronic diarrhea.  Patient underwent colonoscopy found to have multiple diverticuli. She continues to have diarrhea.  Chronic hypoxic respiratory failure: Continue supplemental oxygen , daughter reports noncompliance. Encouraged to use oxygen regularly.  COPD.: Stable.  Chronic.  Not in exacerbation.   Continue home inhalers.  Seizure disorder: Continue Keppra 250 twice daily  Acute metabolic encephalopathy: Likely secondary to UTI and dehydration.  Continue IV hydration. Mental status significantly improved and back to baseline.  Hypokalemia/ Hypomagnesemia: Replaced.  Continue to monitor  Hypophosphatemia: Replaced.  Continue to monitor  DVT prophylaxis: Lovenox Code Status: Full code. Family Communication: No family at bed side. Disposition Plan:   Status is: Inpatient Remains inpatient appropriate because:     Admitted for chronic diarrhea, altered mentation due to UTI,  Mental status back to normal , continue IV antibiotics.   Underwent colonoscopy found to have diverticuli.  Anticipated discharge home tomorrow, if diarrhea improves.  GI clears.  Consultants:  Allergy  Procedures: CT head CT abdomen and pelvis Antimicrobials: Ceftriaxone  Subjective: Patient was seen and examined at bedside.  Overnight events noted.   Patient appears back to her baseline mental status.  Appears pleasant and very interactive. She still reports having diarrhea , reports urinary burning has improved.  Objective: Vitals:   09/30/21 2211 10/01/21 0500 10/01/21 0632 10/01/21 1356  BP: 111/60  116/64 110/81  Pulse: 79  81 77  Resp: '17  17 18  '$ Temp: 98.7 F (37.1 C)  98.7 F (37.1 C) 97.9 F (36.6 C)  TempSrc:      SpO2: 99%  99% 95%  Weight:  48.2 kg    Height:         Intake/Output Summary (Last 24 hours) at 10/01/2021 1430 Last data filed at 10/01/2021 1241 Gross per 24 hour  Intake 2415.42 ml  Output --  Net 2415.42 ml   Filed Weights   09/28/21 0554 09/29/21 0500 10/01/21 0500  Weight: 47.7 kg 49.4 kg 48.2 kg    Examination:  General exam: Appears comfortable, not in any acute distress.  Deconditioned Respiratory system: CTA bilaterally, no wheezing, no crackles, normal respiratory effort. Cardiovascular system: S1 & S2 heard, regular rate and rhythm, no murmur. Gastrointestinal system: Abdomen is soft, non tender, non distended, BS+ Central nervous system: Alert and oriented X 3. No focal neurological deficits. Extremities: No edema, no cyanosis, no clubbing. Skin: No rashes, lesions or ulcers Psychiatry: Judgement and insight appear normal. Mood & affect appropriate.     Data Reviewed: I have personally reviewed following labs and imaging studies  CBC: Recent Labs  Lab 09/27/21 2323 09/29/21 0322 09/30/21 0250 10/01/21 0304  WBC 7.2 5.0 3.9* 3.7*  NEUTROABS 5.8 3.8 2.2 2.3  HGB 13.5 11.0* 11.4* 10.2*  HCT 42.1 34.5* 37.9 32.1*  MCV 101.4* 101.2* 105.0* 103.2*  PLT 187 161 155 831*   Basic Metabolic Panel: Recent Labs  Lab 09/27/21 2323 09/29/21 0543 09/30/21 0250 10/01/21 0304  NA 140 143 142 145  K 4.4 3.1* 3.3* 3.2*  CL 111 113* 115* 120*  CO2 20* 21* 17* 19*  GLUCOSE 91 92 88 103*  BUN '14 10 9 8  '$ CREATININE 1.06* 1.01* 0.89 0.67  CALCIUM 9.1 8.1* 8.2* 7.6*  MG  --  1.5* 2.2 1.8  PHOS  --   --   --  2.2*   GFR: Estimated Creatinine Clearance: 33.8 mL/min (by C-G formula based on SCr of 0.67 mg/dL). Liver Function Tests: Recent Labs  Lab 09/27/21 2323  AST 22  ALT 13  ALKPHOS 107  BILITOT 0.3  PROT 6.9  ALBUMIN 3.9   Recent Labs  Lab 09/27/21 2323  LIPASE 29   No results for input(s): "AMMONIA" in the last 168 hours. Coagulation Profile: No results for input(s): "INR", "PROTIME" in the last  168 hours. Cardiac Enzymes: No results for input(s): "CKTOTAL", "CKMB", "CKMBINDEX", "TROPONINI" in the last 168 hours. BNP (last 3 results) No results for input(s): "PROBNP" in the last 8760 hours. HbA1C: No results for input(s): "HGBA1C" in the last 72 hours. CBG: No results for input(s): "GLUCAP" in the last 168 hours. Lipid Profile: No results for input(s): "CHOL", "HDL", "LDLCALC", "TRIG", "CHOLHDL", "LDLDIRECT" in the last 72 hours. Thyroid Function Tests: No results for input(s): "TSH", "T4TOTAL", "FREET4", "T3FREE", "THYROIDAB" in the last 72 hours. Anemia Panel: No results for input(s): "VITAMINB12", "FOLATE", "FERRITIN", "TIBC", "IRON", "RETICCTPCT" in the last 72 hours. Sepsis Labs: No results for input(s): "PROCALCITON", "LATICACIDVEN" in the last 168 hours.  Recent Results (from the past 240 hour(s))  Gastrointestinal Panel by PCR , Stool     Status: None   Collection Time: 09/27/21  5:01 PM   Specimen: STOOL  Result Value Ref Range Status   Campylobacter species NOT DETECTED NOT DETECTED Final   Plesimonas shigelloides NOT DETECTED NOT DETECTED Final   Salmonella species NOT DETECTED NOT DETECTED Final   Yersinia enterocolitica NOT DETECTED  NOT DETECTED Final   Vibrio species NOT DETECTED NOT DETECTED Final   Vibrio cholerae NOT DETECTED NOT DETECTED Final   Enteroaggregative E coli (EAEC) NOT DETECTED NOT DETECTED Final   Enteropathogenic E coli (EPEC) NOT DETECTED NOT DETECTED Final   Enterotoxigenic E coli (ETEC) NOT DETECTED NOT DETECTED Final   Shiga like toxin producing E coli (STEC) NOT DETECTED NOT DETECTED Final   Shigella/Enteroinvasive E coli (EIEC) NOT DETECTED NOT DETECTED Final   Cryptosporidium NOT DETECTED NOT DETECTED Final   Cyclospora cayetanensis NOT DETECTED NOT DETECTED Final   Entamoeba histolytica NOT DETECTED NOT DETECTED Final   Giardia lamblia NOT DETECTED NOT DETECTED Final   Adenovirus F40/41 NOT DETECTED NOT DETECTED Final    Astrovirus NOT DETECTED NOT DETECTED Final   Norovirus GI/GII NOT DETECTED NOT DETECTED Final   Rotavirus A NOT DETECTED NOT DETECTED Final   Sapovirus (I, II, IV, and V) NOT DETECTED NOT DETECTED Final    Comment: Performed at [facility], [address] [place], [place] [address]  C Difficile Quick Screen w PCR reflex     Status: None   Collection Time: 09/27/21  5:01 PM   Specimen: STOOL  Result Value Ref Range Status   C Diff antigen NEGATIVE NEGATIVE Final   C Diff toxin NEGATIVE NEGATIVE Final   C Diff interpretation No C. difficile detected.  Final    Comment: Performed at [facility], [place] [address], [place], [place] [address]  Urine Culture     Status: Abnormal   Collection Time: 09/27/21 11:48 PM   Specimen: Urine, Clean Catch  Result Value Ref Range Status   Specimen Description   Final    URINE, CLEAN CATCH Performed at [facility], [place] [address], [place], [place] [address]    Special Requests   Final    NONE Performed at [facility], [place] [address] [place], [place] [address]    Culture >=100,000 COLONIES/mL PROTEUS MIRABILIS (A)  Final   Report Status 09/30/2021 FINAL  Final   Organism ID, Bacteria PROTEUS MIRABILIS (A)  Final      Susceptibility   Proteus mirabilis - MIC*    AMPICILLIN <=2 SENSITIVE Sensitive     CEFAZOLIN <=4 SENSITIVE Sensitive     CEFEPIME 0.5 SENSITIVE Sensitive     CEFTRIAXONE <=0.25 SENSITIVE Sensitive     CIPROFLOXACIN <=0.25 SENSITIVE Sensitive     GENTAMICIN <=1 SENSITIVE Sensitive     IMIPENEM 2 SENSITIVE Sensitive     NITROFURANTOIN 128 RESISTANT Resistant     TRIMETH/SULFA <=20 SENSITIVE Sensitive     AMPICILLIN/SULBACTAM <=2 SENSITIVE Sensitive     PIP/TAZO <=4 SENSITIVE Sensitive     * >=100,000 COLONIES/mL PROTEUS MIRABILIS    Radiology Studies: No results found.  Scheduled Meds:  ARIPiprazole  5 mg Oral Daily   buPROPion  150 mg Oral q morning    cephALEXin  500 mg Oral Q12H   heparin  5,000 Units Subcutaneous Q8H   levETIRAcetam  250 mg Oral BID   oxybutynin  5 mg Oral TID   pravastatin  40 mg Oral q1800   sertraline  150 mg Oral Daily   Continuous Infusions:  sodium chloride Stopped (10/01/21 1231)   potassium PHOSPHATE IVPB (in mmol) 30 mmol (10/01/21 1403)     LOS: 1 day    Time spent:35 mins    Shawna Clamp, MD [facility]   If 7PM-7AM, please contact night-coverage

## 2021-10-01 NOTE — Plan of Care (Signed)
  Problem: Education: Goal: Knowledge of General Education information will improve Description Including pain rating scale, medication(s)/side effects and non-pharmacologic comfort measures Outcome: Progressing   Problem: Health Behavior/Discharge Planning: Goal: Ability to manage health-related needs will improve Outcome: Progressing   

## 2021-10-01 NOTE — Plan of Care (Signed)

## 2021-10-01 NOTE — Plan of Care (Signed)
Patient was supposed to be discharged today,  awaiting GI clearance.   RN reports patient's daughter COVID+, Patient reports  dry cough, COVID test ordered , came back positive.  Patient put on isolation.

## 2021-10-02 DIAGNOSIS — N3000 Acute cystitis without hematuria: Secondary | ICD-10-CM | POA: Diagnosis not present

## 2021-10-02 DIAGNOSIS — R197 Diarrhea, unspecified: Secondary | ICD-10-CM

## 2021-10-02 LAB — BASIC METABOLIC PANEL
Anion gap: 8 (ref 5–15)
BUN: <5 mg/dL — ABNORMAL LOW (ref 8–23)
CO2: 20 mmol/L — ABNORMAL LOW (ref 22–32)
Calcium: 8 mg/dL — ABNORMAL LOW (ref 8.9–10.3)
Chloride: 117 mmol/L — ABNORMAL HIGH (ref 98–111)
Creatinine, Ser: 0.69 mg/dL (ref 0.44–1.00)
GFR, Estimated: >60 mL/min (ref >60)
Glucose, Bld: 82 mg/dL (ref 70–99)
Potassium: 3.5 mmol/L (ref 3.5–5.1)
Sodium: 145 mmol/L (ref 135–145)

## 2021-10-02 LAB — CBC WITH DIFFERENTIAL/PLATELET
Abs Immature Granulocytes: 0.01 10*3/uL (ref 0.00–0.07)
Basophils Absolute: 0 10*3/uL (ref 0.0–0.1)
Basophils Relative: 0 %
Eosinophils Absolute: 0.2 10*3/uL (ref 0.0–0.5)
Eosinophils Relative: 5 %
HCT: 34.1 % — ABNORMAL LOW (ref 36.0–46.0)
Hemoglobin: 10.7 g/dL — ABNORMAL LOW (ref 12.0–15.0)
Immature Granulocytes: 0 %
Lymphocytes Relative: 22 %
Lymphs Abs: 0.9 10*3/uL (ref 0.7–4.0)
MCH: 31.8 pg (ref 26.0–34.0)
MCHC: 31.4 g/dL (ref 30.0–36.0)
MCV: 101.2 fL — ABNORMAL HIGH (ref 80.0–100.0)
Monocytes Absolute: 0.5 10*3/uL (ref 0.1–1.0)
Monocytes Relative: 12 %
Neutro Abs: 2.4 10*3/uL (ref 1.7–7.7)
Neutrophils Relative %: 61 %
Platelets: 146 10*3/uL — ABNORMAL LOW (ref 150–400)
RBC: 3.37 MIL/uL — ABNORMAL LOW (ref 3.87–5.11)
RDW: 16.2 % — ABNORMAL HIGH (ref 11.5–15.5)
WBC: 4 10*3/uL (ref 4.0–10.5)
nRBC: 0 % (ref 0.0–0.2)

## 2021-10-02 LAB — PHOSPHORUS: Phosphorus: 3.3 mg/dL (ref 2.5–4.6)

## 2021-10-02 LAB — MAGNESIUM: Magnesium: 1.6 mg/dL — ABNORMAL LOW (ref 1.7–2.4)

## 2021-10-02 MED ORDER — DIPHENOXYLATE-ATROPINE 2.5-0.025 MG PO TABS
1.0000 | ORAL_TABLET | Freq: Three times a day (TID) | ORAL | Status: DC
  Administered 2021-10-02 – 2021-10-04 (×6): 1 via ORAL
  Filled 2021-10-02 (×6): qty 1

## 2021-10-02 MED ORDER — DIPHENOXYLATE-ATROPINE 2.5-0.025 MG PO TABS: 2.0000 | ORAL_TABLET | Freq: Three times a day (TID) | ORAL | Status: DC

## 2021-10-02 MED ORDER — MAGNESIUM SULFATE 2 GM/50ML IV SOLN
2.0000 g | Freq: Once | INTRAVENOUS | Status: AC
  Administered 2021-10-02: 2 g via INTRAVENOUS
  Filled 2021-10-02: qty 50

## 2021-10-02 NOTE — Progress Notes (Signed)
PROGRESS NOTE  Michele Harper  DOB: Apr 18, 1935  PCP: Wayland Salinas, MD WHQ:[id number]  DOA: 09/27/2021  LOS: 2 days  Hospital Day: 6  Brief narrative: Michele Harper is a 86 y.o. female with PMH significant for COPD on 2 L oxygen at home as needed, peripheral artery disease, prior history of breast cancer.   8/7, patient presented to the ED with complaint of diarrhea for 4 to 5 weeks, nausea and vomiting for about a week.  Patient apparently lost about 8 pounds in a week.  He was seen by her PCP and was referred to GI but she has not had an appointment yet.  In the previous 48 hours prior to presentation, patient also had altered mental status and hence brought to ED Work-up in the ED showed a urinalysis positive for nitrites, unremarkable CT head and unremarkable CT abdomen pelvis Patient is admitted for acute metabolic encephalopathy due to UTI , started on IV antibiotics.   GI was consulted for chronic diarrhea.   Patient underwent colonoscopy and was found to have multiple diverticuli in sigmoid colon.  On 8/11, patient was prepared for discharge.  However her daughter at home tested positive for COVID.  Patient was tested as well and resulted COVID-positive.  Patient was put in isolation and discharge was canceled.  Subjective: Patient was seen and examined this morning.  Pleasant elderly Caucasian female.  Lying down in bed.  Not in distress.  No new symptoms.  Continues to have diarrhea.  Had about 6 episodes in last 24 hours. Chart reviewed Remains hemodynamically stable in last 24 hours.    Assessment and plan: Proteus UTI Patient presented with altered mentation, nausea , vomiting and diarrhea. UA consistent with UTI , Nitrites+, LE+, initiated on IV ceftriaxone. Urine cultures grew Proteus mirabilis sensitive to ceftriaxone.  Currently on oral Keflex for total of 7-day course of antibiotics.   Acute kidney injury secondary to dehydration Looked dehydrated on admission  with creatinine at 1.06.  Likely contributed by diarrhea as well as poor intake due to confusion.  Creatinine improved with IV fluid. Recent Labs    09/27/21 2323 09/29/21 0543 09/30/21 0250 10/01/21 0304 10/02/21 0320  BUN '14 10 9 8 '$ <5*  CREATININE 1.06* 1.01* 0.89 0.67 0.69    Chronic diarrhea Patient has diarrhea for 5 weeks.  Daughter reports Lomotil has not been effective. Patient has remote history of C. difficile.   She has been on antibiotics recently in the last 1 to 2 months for upper respiratory tract infection.   C. difficile negative, GI panel negative GI is consulted , given chronic diarrhea.  Patient underwent colonoscopy found to have multiple diverticuli. She continues to have diarrhea.  She had about 6 episodes in last 24 hours.  Per GI recommendation this morning, Lomotil have increased to 2 mg 3 times daily. Remains on normal saline at 100 mill per hour.  COVID-positive On 8/11, patient was prepared for discharge.  However her daughter at home tested positive for COVID.  Patient was tested as well and resulted COVID-positive.  Patient was put in isolation and discharge was canceled. Currently not on active treatment for COVID.   Chronic hypoxic respiratory failure On 2 L oxygen at home as needed.   COPD Continue home inhalers.   H/o Seizure disorder Continue Keppra 250 twice daily   Acute metabolic encephalopathy Likely secondary to UTI and dehydration.  Continue IV hydration. Mental status significantly improved and back to baseline.   Hypokalemia/  Hypomagnesemia/hypophosphatemia Replaced, rechecked. Magnesium low at 1.6 this morning.  Replacement given. Recent Labs  Lab 09/27/21 2323 09/29/21 0543 09/30/21 0250 10/01/21 0304 10/02/21 0320  K 4.4 3.1* 3.3* 3.2* 3.5  MG  --  1.5* 2.2 1.8 1.6*  PHOS  --   --   --  2.2* 3.3     Goals of care   Code Status: Full Code    Mobility: Encourage ambulation.  Home with PT recommended  Skin  assessment:     Nutritional status:  Body mass index is 22.9 kg/m.          Diet:  Diet Order             Diet regular Room service appropriate? Yes; Fluid consistency: Thin  Diet effective now                   DVT prophylaxis:  heparin injection 5,000 Units Start: 09/28/21 0645 SCDs Start: 09/28/21 0600   Antimicrobials: None Fluid: NS at 100 mill per hour Consultants: GI Family Communication: Called patient's daughter.  Did not respond.  Mailbox was full.  Status is: Inpatient  Continue in-hospital care because: Continues have diarrhea, COVID-positive Level of care: Med-Surg   Dispo: The patient is from: Home              Anticipated d/c is to: Hopefully home with home health PT tomorrow              Patient currently is not medically stable to d/c.   Difficult to place patient No     Infusions:   sodium chloride 100 mL/hr at 10/02/21 5465    Scheduled Meds:  ARIPiprazole  5 mg Oral Daily   buPROPion  150 mg Oral q morning   cephALEXin  500 mg Oral Q12H   diphenoxylate-atropine  1 tablet Oral TID AC   heparin  5,000 Units Subcutaneous Q8H   levETIRAcetam  250 mg Oral BID   oxybutynin  5 mg Oral TID   pravastatin  40 mg Oral q1800   sertraline  150 mg Oral Daily    PRN meds: acetaminophen **OR** acetaminophen, albuterol, ondansetron **OR** ondansetron (ZOFRAN) IV, mouth rinse   Antimicrobials: Anti-infectives (From admission, onward)    Start     Dose/Rate Route Frequency Ordered Stop   10/01/21 2200  cephALEXin (KEFLEX) capsule 500 mg        500 mg Oral Every 12 hours 10/01/21 0947 10/05/21 0959   09/29/21 0100  cefTRIAXone (ROCEPHIN) 1 g in sodium chloride 0.9 % 100 mL IVPB  Status:  Discontinued        1 g 200 mL/hr over 30 Minutes Intravenous Every 24 hours 09/28/21 0559 10/01/21 0947   09/28/21 0045  cefTRIAXone (ROCEPHIN) 1 g in sodium chloride 0.9 % 100 mL IVPB        1 g 200 mL/hr over 30 Minutes Intravenous STAT 09/28/21 0033  09/28/21 0140       Objective: Vitals:   10/01/21 2016 10/02/21 1327  BP: 136/67 138/68  Pulse: 79 72  Resp: 16 18  Temp: 98.1 F (36.7 C) 97.9 F (36.6 C)  SpO2: 100% 99%    Intake/Output Summary (Last 24 hours) at 10/02/2021 1452 Last data filed at 10/02/2021 0658 Gross per 24 hour  Intake 745.36 ml  Output 300 ml  Net 445.36 ml   Filed Weights   09/29/21 0500 10/01/21 0500 10/02/21 0500  Weight: 49.4 kg 48.2 kg 49.7 kg  Weight change: 1.5 kg Body mass index is 22.9 kg/m.   Physical Exam: General exam: Pleasant, elderly Caucasian female.  Not in physical distress Skin: No rashes, lesions or ulcers. HEENT: Atraumatic, normocephalic, no obvious bleeding Lungs: Clear to auscultation bilaterally CVS: Regular rate and rhythm, no murmur GI/Abd soft, nontender, nondistended, bowel sound present CNS: Alert, awake, oriented x3 Psychiatry: Mood appropriate Extremities: No pedal edema, no calf tenderness  Data Review: I have personally reviewed the laboratory data and studies available.  F/u labs ordered Unresulted Labs (From admission, onward)     Start     Ordered   09/29/21 0500  CBC with Differential/Platelet  Daily at 5am,   R     Question:  Specimen collection method  Answer:  Lab=Lab collect   09/28/21 1243   09/29/21 6606  Basic metabolic panel  Daily at 5am,   R     Question:  Specimen collection method  Answer:  Lab=Lab collect   09/28/21 1243   09/29/21 0500  Magnesium  Daily at 5am,   R     Question:  Specimen collection method  Answer:  Lab=Lab collect   09/28/21 1243            Signed, Terrilee Croak, MD [facility] 10/02/2021

## 2021-10-02 NOTE — Progress Notes (Addendum)
   Patient Name: Michele Harper Date of Encounter: 10/02/2021, 9:04 AM    Subjective  " I still have it" - diarrhea 6x yesterday None yet today Lomotil 1 tid yesterday     Objective  BP 136/67 (BP Location: Right Arm)   Pulse 79   Temp 98.1 F (36.7 C)   Resp 16   Ht '4\' 10"'$  (1.473 m)   Wt 49.7 kg   LMP  (LMP Unknown)   SpO2 100%   BMI 22.90 kg/m  NAD  Lab Results  Component Value Date   WBC 4.0 10/02/2021   HGB 10.7 (L) 10/02/2021   HCT 34.1 (L) 10/02/2021   MCV 101.2 (H) 10/02/2021   PLT 146 (L) 10/02/2021   Lab Results  Component Value Date   CREATININE 0.69 10/02/2021   BUN <5 (L) 10/02/2021   NA 145 10/02/2021   K 3.5 10/02/2021   CL 117 (H) 10/02/2021   CO2 20 (L) 10/02/2021       Assessment and Plan  Diarrhea - no clear cause - C diff GI path panel and random bxs all negative  Change Lomotil to 2 tid CORRECTION - KEEP AT 1 TID SHE HAS ONLY HAD 1 DOSE YESTERDAY AND 1 TODAY  Gatha Mayer, MD, [facility] See Shea Evans on call - gastroenterology for best contact person 10/02/2021 9:04 AM

## 2021-10-02 NOTE — Plan of Care (Signed)
  Problem: Education: Goal: Knowledge of General Education information will improve Description: Including pain rating scale, medication(s)/side effects and non-pharmacologic comfort measures Outcome: Progressing   Problem: Safety: Goal: Ability to remain free from injury will improve Outcome: Progressing   

## 2021-10-03 DIAGNOSIS — N3000 Acute cystitis without hematuria: Secondary | ICD-10-CM | POA: Diagnosis not present

## 2021-10-03 LAB — CBC WITH DIFFERENTIAL/PLATELET
Abs Immature Granulocytes: 0.02 10*3/uL (ref 0.00–0.07)
Basophils Absolute: 0 10*3/uL (ref 0.0–0.1)
Basophils Relative: 0 %
Eosinophils Absolute: 0.2 10*3/uL (ref 0.0–0.5)
Eosinophils Relative: 6 %
HCT: 33 % — ABNORMAL LOW (ref 36.0–46.0)
Hemoglobin: 10.1 g/dL — ABNORMAL LOW (ref 12.0–15.0)
Immature Granulocytes: 1 %
Lymphocytes Relative: 32 %
Lymphs Abs: 1.1 10*3/uL (ref 0.7–4.0)
MCH: 31.9 pg (ref 26.0–34.0)
MCHC: 30.6 g/dL (ref 30.0–36.0)
MCV: 104.1 fL — ABNORMAL HIGH (ref 80.0–100.0)
Monocytes Absolute: 0.4 10*3/uL (ref 0.1–1.0)
Monocytes Relative: 11 %
Neutro Abs: 1.7 10*3/uL (ref 1.7–7.7)
Neutrophils Relative %: 50 %
Platelets: 127 10*3/uL — ABNORMAL LOW (ref 150–400)
RBC: 3.17 MIL/uL — ABNORMAL LOW (ref 3.87–5.11)
RDW: 16.4 % — ABNORMAL HIGH (ref 11.5–15.5)
WBC: 3.3 10*3/uL — ABNORMAL LOW (ref 4.0–10.5)
nRBC: 0 % (ref 0.0–0.2)

## 2021-10-03 LAB — BASIC METABOLIC PANEL
Anion gap: 6 (ref 5–15)
BUN: <5 mg/dL — ABNORMAL LOW (ref 8–23)
CO2: 19 mmol/L — ABNORMAL LOW (ref 22–32)
Calcium: 7.9 mg/dL — ABNORMAL LOW (ref 8.9–10.3)
Chloride: 119 mmol/L — ABNORMAL HIGH (ref 98–111)
Creatinine, Ser: 0.61 mg/dL (ref 0.44–1.00)
GFR, Estimated: >60 mL/min (ref >60)
Glucose, Bld: 81 mg/dL (ref 70–99)
Potassium: 3.5 mmol/L (ref 3.5–5.1)
Sodium: 144 mmol/L (ref 135–145)

## 2021-10-03 LAB — MAGNESIUM: Magnesium: 2.2 mg/dL (ref 1.7–2.4)

## 2021-10-03 NOTE — Plan of Care (Signed)

## 2021-10-03 NOTE — Plan of Care (Signed)
  Problem: Clinical Measurements: Goal: Ability to maintain clinical measurements within normal limits will improve Outcome: Progressing   Problem: Activity: Goal: Risk for activity intolerance will decrease Outcome: Progressing   Problem: Nutrition: Goal: Adequate nutrition will be maintained Outcome: Progressing   Problem: Coping: Goal: Level of anxiety will decrease Outcome: Progressing   Problem: Safety: Goal: Ability to remain free from injury will improve Outcome: Progressing   Problem: Pain Managment: Goal: General experience of comfort will improve Outcome: Progressing

## 2021-10-03 NOTE — Progress Notes (Signed)
PROGRESS NOTE  Michele Harper  DOB: 1936-01-28  PCP: Wayland Salinas, MD TDD:[id number]  DOA: 09/27/2021  LOS: 3 days  Hospital Day: 7  Brief narrative: Michele Harper is a 86 y.o. female with PMH significant for COPD on 2 L oxygen at home as needed, peripheral artery disease, prior history of breast cancer.   8/7, patient presented to the ED with complaint of diarrhea for 4 to 5 weeks, nausea and vomiting for about a week.  Patient apparently lost about 8 pounds in a week.  He was seen by her PCP and was referred to GI but she has not had an appointment yet.  In the previous 48 hours prior to presentation, patient also had altered mental status and hence brought to ED Work-up in the ED showed a urinalysis positive for nitrites, unremarkable CT head and unremarkable CT abdomen pelvis Patient is admitted for acute metabolic encephalopathy due to UTI , started on IV antibiotics.   GI was consulted for chronic diarrhea.   Patient underwent colonoscopy and was found to have multiple diverticuli in sigmoid colon.  On 8/11, patient was prepared for discharge.  However her daughter at home tested positive for COVID.  Patient was tested as well and resulted COVID-positive.  Patient was put in isolation and discharge was canceled.  Subjective: Patient was seen and examined this morning.  Lying on bed.  Not in distress.  On low-flow oxygen.  Patient states that her daughter has been hospitalized and is getting discharged tomorrow.  She has no way to be home with her daughter being there first.  Assessment and plan: Proteus UTI Patient presented with altered mentation, nausea , vomiting and diarrhea. UA consistent with UTI , Nitrites+, LE+, initiated on IV ceftriaxone. Urine cultures grew Proteus mirabilis sensitive to ceftriaxone.  Currently on oral Keflex for total of 7-day course of antibiotics.   Acute kidney injury secondary to dehydration Looked dehydrated on admission with creatinine at  1.06.  Likely contributed by diarrhea as well as poor intake due to confusion.  Creatinine improved with IV fluid. Recent Labs    09/27/21 2323 09/29/21 0543 09/30/21 0250 10/01/21 0304 10/02/21 0320 10/03/21 0342  BUN '14 10 9 8 '$ <5* <5*  CREATININE 1.06* 1.01* 0.89 0.67 0.69 0.61    Chronic diarrhea Patient has diarrhea for 5 weeks.  Daughter reports Lomotil has not been effective. Patient has remote history of C. difficile.   She has been on antibiotics recently in the last 1 to 2 months for upper respiratory tract infection.   C. difficile negative, GI panel negative GI is consulted , given chronic diarrhea.  Patient underwent colonoscopy found to have multiple diverticuli. Diarrhea improving Lomotil dose was increased yesterday.  Okay to stop IV fluid  COVID-positive On 8/11, patient was prepared for discharge.  However her daughter at home tested positive for COVID.  Patient was tested as well and resulted COVID-positive.  Patient was put in isolation and discharge was canceled. Currently not on active treatment for COVID.   Chronic hypoxic respiratory failure On 2 L oxygen at home as needed.   COPD Continue home inhalers.   H/o Seizure disorder Continue Keppra 250 twice daily   Acute metabolic encephalopathy Likely secondary to UTI and dehydration.  Continue IV hydration. Mental status significantly improved and back to baseline.   Hypokalemia/ Hypomagnesemia/hypophosphatemia Levels checked and replaced Recent Labs  Lab 09/29/21 0543 09/30/21 0250 10/01/21 0304 10/02/21 0320 10/03/21 0342  K 3.1* 3.3* 3.2* 3.5  3.5  MG 1.5* 2.2 1.8 1.6* 2.2  PHOS  --   --  2.2* 3.3  --      Goals of care   Code Status: Full Code    Mobility: Encourage ambulation.  Home with PT recommended  Skin assessment:     Nutritional status:  Body mass index is 21.79 kg/m.          Diet:  Diet Order             Diet regular Room service appropriate? Yes; Fluid  consistency: Thin  Diet effective now                   DVT prophylaxis:  heparin injection 5,000 Units Start: 09/28/21 0645 SCDs Start: 09/28/21 0600   Antimicrobials: None Fluid: Okay to stop IV fluid today Consultants: GI Family Communication: Family not at bedside  Status is: Inpatient  Continue in-hospital care because: Continues have diarrhea, COVID-positive Level of care: Med-Surg   Dispo: The patient is from: Home              Anticipated d/c is to: Hopefully home with home health PT tomorrow              Patient currently is not medically stable to d/c.   Difficult to place patient No     Infusions:     Scheduled Meds:  ARIPiprazole  5 mg Oral Daily   buPROPion  150 mg Oral q morning   cephALEXin  500 mg Oral Q12H   diphenoxylate-atropine  1 tablet Oral TID AC   heparin  5,000 Units Subcutaneous Q8H   levETIRAcetam  250 mg Oral BID   oxybutynin  5 mg Oral TID   pravastatin  40 mg Oral q1800   sertraline  150 mg Oral Daily    PRN meds: acetaminophen **OR** acetaminophen, albuterol, ondansetron **OR** ondansetron (ZOFRAN) IV, mouth rinse   Antimicrobials: Anti-infectives (From admission, onward)    Start     Dose/Rate Route Frequency Ordered Stop   10/01/21 2200  cephALEXin (KEFLEX) capsule 500 mg        500 mg Oral Every 12 hours 10/01/21 0947 10/05/21 0959   09/29/21 0100  cefTRIAXone (ROCEPHIN) 1 g in sodium chloride 0.9 % 100 mL IVPB  Status:  Discontinued        1 g 200 mL/hr over 30 Minutes Intravenous Every 24 hours 09/28/21 0559 10/01/21 0947   09/28/21 0045  cefTRIAXone (ROCEPHIN) 1 g in sodium chloride 0.9 % 100 mL IVPB        1 g 200 mL/hr over 30 Minutes Intravenous STAT 09/28/21 0033 09/28/21 0140       Objective: Vitals:   10/03/21 0601 10/03/21 1321  BP: 138/61 137/69  Pulse: 84 88  Resp: 18 18  Temp: 98.4 F (36.9 C) 98.3 F (36.8 C)  SpO2: 100% 98%    Intake/Output Summary (Last 24 hours) at 10/03/2021 1358 Last  data filed at 10/03/2021 0600 Gross per 24 hour  Intake 710 ml  Output --  Net 710 ml   Filed Weights   10/01/21 0500 10/02/21 0500 10/03/21 0500  Weight: 48.2 kg 49.7 kg 47.3 kg   Weight change: -2.4 kg Body mass index is 21.79 kg/m.   Physical Exam: General exam: Pleasant, elderly Caucasian female.  Not in physical distress Skin: No rashes, lesions or ulcers. HEENT: Atraumatic, normocephalic, no obvious bleeding Lungs: Clear to auscultation bilaterally CVS: Regular rate and rhythm, no murmur GI/Abd  soft, nontender, nondistended, bowel sound present CNS: Alert, awake, oriented x3 Psychiatry: Mood appropriate Extremities: No pedal edema, no calf tenderness  Data Review: I have personally reviewed the laboratory data and studies available.  F/u labs ordered Unresulted Labs (From admission, onward)    None       Signed, Terrilee Croak, MD [facility] 10/03/2021

## 2021-10-03 NOTE — Progress Notes (Signed)
   Patient Name: Michele Harper Date of Encounter: 10/03/2021, 8:57 AM    Subjective  Less diarrhea-1 stool since yesterday morning now on 3 times daily Lomotil   Objective  BP 138/61 (BP Location: Left Arm)   Pulse 84   Temp 98.4 F (36.9 C) (Oral)   Resp 18   Ht '4\' 10"'$  (1.473 m)   Wt 47.3 kg   LMP  (LMP Unknown)   SpO2 100%   BMI 21.79 kg/m  Elderly white woman in no acute distress abdomen soft nontender bowel sounds present    Latest Ref Rng & Units 10/03/2021    3:42 AM 10/02/2021    3:20 AM 10/01/2021    3:04 AM  CBC  WBC 4.0 - 10.5 K/uL 3.3  4.0  3.7   Hemoglobin 12.0 - 15.0 g/dL 10.1  10.7  10.2   Hematocrit 36.0 - 46.0 % 33.0  34.1  32.1   Platelets 150 - 400 K/uL 127  146  136       Assessment and Plan  Diarrhea of unclear cause negative infectious and colonoscopy/biopsy evaluation.  Improved on generic Lomotil 3 times daily.   Would continue Lomotil watch for constipation and adjust dosing appropriately.  GI signing off  Gatha Mayer, MD, [facility] See Shea Evans on call - gastroenterology for best contact person 10/03/2021 8:57 AM

## 2021-10-04 DIAGNOSIS — N3000 Acute cystitis without hematuria: Secondary | ICD-10-CM | POA: Diagnosis not present

## 2021-10-04 MED ORDER — DIPHENOXYLATE-ATROPINE 2.5-0.025 MG PO TABS: 1.0000 | ORAL_TABLET | Freq: Three times a day (TID) | ORAL | 0 refills | Status: AC

## 2021-10-04 MED ORDER — POTASSIUM CHLORIDE CRYS ER 20 MEQ PO TBCR
20.0000 meq | EXTENDED_RELEASE_TABLET | Freq: Every day | ORAL | Status: AC
Start: 2021-10-04 — End: ?

## 2021-10-04 MED ORDER — CEPHALEXIN 500 MG PO CAPS: 500.0000 mg | ORAL_CAPSULE | Freq: Two times a day (BID) | ORAL | 0 refills | Status: AC

## 2021-10-04 NOTE — TOC Progression Note (Signed)
Transition of Care [facility]) - Progression Note   Patient Details  Name: Michele Harper MRN: [id number] Date of Birth: October 30, 1935  Transition of [facility] CM/SW [place], LCSW Phone Number: 10/04/2021, 12:54 PM  Clinical Narrative: PT and OT orders have been placed. CSW updated Delsa Sale with [facility].  Expected Discharge Plan: [place] Barriers to Discharge: Continued Medical Work up  Expected Discharge Plan and Services Expected Discharge Plan: [place] In-house Referral: Clinical Social Work Post Acute Care Choice: [place] arrangements for the past 2 months: Apartment              DME Arranged: N/A DME Agency: NA HH Arranged: PT, OT [place] Agency: [facility] Date [place]: 09/29/21 Representative spoke with at [place]: Delsa Sale  Readmission Risk Interventions     No data to display

## 2021-10-04 NOTE — Discharge Summary (Signed)
Physician Discharge Summary  Michele Harper XFG:[id number] DOB: [birth year] DOA: 09/27/2021  PCP: Wayland Salinas, MD  Admit date: 09/27/2021 Discharge date: 10/04/2021  Admitted From: Home Discharge disposition: Home with home health services  Recommendations at discharge:  Continue antibiotics for 3 more days to complete the course  Continue Lomotil as needed for diarrhea  Brief narrative: Michele Harper is a 86 y.o. female with PMH significant for COPD on 2 L oxygen at home as needed, peripheral artery disease, prior history of breast cancer.   8/7, patient presented to the ED with complaint of diarrhea for 4 to 5 weeks, nausea and vomiting for about a week.  Patient apparently lost about 8 pounds in a week.  He was seen by her PCP and was referred to GI but she has not had an appointment yet.  In the previous 48 hours prior to presentation, patient also had altered mental status and hence brought to ED Work-up in the ED showed a urinalysis positive for nitrites, unremarkable CT head and unremarkable CT abdomen pelvis Patient is admitted for acute metabolic encephalopathy due to UTI , started on IV antibiotics.   GI was consulted for chronic diarrhea.   Patient underwent colonoscopy and was found to have multiple diverticuli in sigmoid colon.  On 8/11, patient was prepared for discharge.  However her daughter at home tested positive for COVID.  Patient was tested as well and resulted COVID-positive.  Patient was put in isolation and discharge was canceled.  Subjective: Patient was seen and examined this morning.  Propped up in bed.  Not in distress.  On low-flow oxygen. Called and updated her daughter Ms. Joycelyn Schmid will be able to pick her up this afternoon.  Assessment and plan: Proteus UTI Patient presented with altered mentation, nausea , vomiting and diarrhea. UA consistent with UTI , Nitrites+, LE+, initiated on IV ceftriaxone. Urine cultures grew Proteus mirabilis sensitive  to ceftriaxone.  Currently on oral Keflex for total of 7-day course of antibiotics.   Acute kidney injury secondary to dehydration Looked dehydrated on admission with creatinine at 1.06.  Likely contributed by diarrhea as well as poor intake due to confusion.  Creatinine improved with IV fluid. Recent Labs    09/27/21 2323 09/29/21 0543 09/30/21 0250 10/01/21 0304 10/02/21 0320 10/03/21 0342  BUN '14 10 9 8 '$ <5* <5*  CREATININE 1.06* 1.01* 0.89 0.67 0.69 0.61    Chronic diarrhea Patient has diarrhea for 5 weeks.  Daughter reports Lomotil has not been effective. Patient has remote history of C. difficile.   She was on antibiotics recently in the last 1 to 2 months for upper respiratory tract infection.   C. difficile negative, GI panel negative GI was consulted.  Patient underwent colonoscopy found to have multiple diverticuli. Diarrhea improving on Lomotil.  Continue as needed post discharge.  COVID-positive On 8/11, patient was prepared for discharge.  However her daughter at home tested positive for COVID.  Patient was also tested and resulted COVID-positive.  Patient was put in isolation and discharge was canceled.  Last 48 hours, patient has remained stable.  I called and updated her daughter this morning.  Her daughter and caretaker also tested positive for COVID but they are also clinically stable and able to take care of patient at home.  Chronic hypoxic respiratory failure On 2 L oxygen at home as needed.   COPD Continue home inhalers.   H/o Seizure disorder Continue Keppra 250 twice daily   Acute metabolic encephalopathy  Likely secondary to UTI and dehydration.  Elevated hydrated oh Mental status significantly improved and back to baseline.   Hypokalemia/ Hypomagnesemia/hypophosphatemia Levels checked and replaced Recent Labs  Lab 09/29/21 0543 09/30/21 0250 10/01/21 0304 10/02/21 0320 10/03/21 0342  K 3.1* 3.3* 3.2* 3.5 3.5  MG 1.5* 2.2 1.8 1.6* 2.2  PHOS  --    --  2.2* 3.3  --     Wounds:  -    Discharge Exam:   Vitals:   10/03/21 1321 10/03/21 2207 10/04/21 0516 10/04/21 1332  BP: 137/69 (!) 146/73 118/64 111/60  Pulse: 88 91 76 78  Resp: '18 18 18 18  '$ Temp: 98.3 F (36.8 C) 98.6 F (37 C) 98 F (36.7 C) 98.4 F (36.9 C)  TempSrc: Oral   Oral  SpO2: 98% 92% 90% 97%  Weight:      Height:        Body mass index is 21.79 kg/m.  General exam: Pleasant, elderly Caucasian female.  Not in physical distress Skin: No rashes, lesions or ulcers. HEENT: Atraumatic, normocephalic, no obvious bleeding Lungs: diminished changes in both bases.  Coughs on deep breathing.  Otherwise clear to auscultation CVS: Regular rate and rhythm, no murmur GI/Abd soft, nontender, nondistended, bowel sound present CNS: Alert, awake, oriented x3 Psychiatry: Mood appropriate Extremities: No pedal edema, no calf tenderness  Follow ups:    Follow-up Information     Ryter-Brown, Shyrl Numbers, MD Follow up.   Specialty: Family Medicine Contact information: [place] [id number] [phone]         Gatha Mayer, MD Follow up.   Specialty: Gastroenterology Contact information: [address] [place] [address] [phone]                 Discharge Instructions:   Discharge Instructions     Call MD for:  difficulty breathing, headache or visual disturbances   Complete by: As directed    Call MD for:  extreme fatigue   Complete by: As directed    Call MD for:  hives   Complete by: As directed    Call MD for:  persistant dizziness or light-headedness   Complete by: As directed    Call MD for:  persistant nausea and vomiting   Complete by: As directed    Call MD for:  severe uncontrolled pain   Complete by: As directed    Call MD for:  temperature >100.4   Complete by: As directed    Diet general   Complete by: As directed    Discharge instructions   Complete by: As directed     Recommendations at discharge:   Continue antibiotics for 3 more days to complete the course   Continue Lomotil as needed for diarrhea  General discharge instructions: Follow with Primary MD Ryter-Brown, Shyrl Numbers, MD in 7 days  Please request your PCP  to go over your hospital tests, procedures, radiology results at the follow up. Please get your medicines reviewed and adjusted.  Your PCP may decide to repeat certain labs or tests as needed. Do not drive, operate heavy machinery, perform activities at heights, swimming or participation in water activities or provide baby sitting services if your were admitted for syncope or siezures until you have seen by Primary MD or a Neurologist and advised to do so again. [place] Controlled Substance Reporting System database was reviewed. Do not drive, operate heavy machinery, perform activities at heights, swim, participate in water  activities or provide baby-sitting services while on medications for pain, sleep and mood until your outpatient physician has reevaluated you and advised to do so again.  You are strongly recommended to comply with the dose, frequency and duration of prescribed medications. Activity: As tolerated with Full fall precautions use walker/cane & assistance as needed Avoid using any recreational substances like cigarette, tobacco, alcohol, or non-prescribed drug. If you experience worsening of your admission symptoms, develop shortness of breath, life threatening emergency, suicidal or homicidal thoughts you must seek medical attention immediately by calling 911 or calling your MD immediately  if symptoms less severe. You must read complete instructions/literature along with all the possible adverse reactions/side effects for all the medicines you take and that have been prescribed to you. Take any new medicine only after you have completely understood and accepted all the possible adverse reactions/side effects.  Wear Seat belts  while driving. You were cared for by a hospitalist during your hospital stay. If you have any questions about your discharge medications or the care you received while you were in the hospital after you are discharged, you can call the unit and ask to speak with the hospitalist or the covering physician. Once you are discharged, your primary care physician will handle any further medical issues. Please note that NO REFILLS for any discharge medications will be authorized once you are discharged, as it is imperative that you return to your primary care physician (or establish a relationship with a primary care physician if you do not have one).   Increase activity slowly   Complete by: As directed        Discharge Medications:   Allergies as of 10/04/2021       Reactions   Ertapenem Other (See Comments)   Other reaction(s): Confusion She started getting confused on week 3 of treatment, resolved off the medication.    Penicillins Itching, Rash   But can take amoxicillin.    Procaine Rash   Childhood allergy   Alendronate Rash, Other (See Comments)   osteomylititis        Medication List     STOP taking these medications    Anti-Diarrheal 2 MG tablet Generic drug: loperamide       TAKE these medications    acetaminophen 500 MG tablet Commonly known as: TYLENOL Take 500 mg by mouth 2 (two) times daily.   ARIPiprazole 5 MG tablet Commonly known as: ABILIFY Take 5 mg by mouth daily.   buPROPion 150 MG 24 hr tablet Commonly known as: WELLBUTRIN XL Take 150 mg by mouth every morning.   cephALEXin 500 MG capsule Commonly known as: KEFLEX Take 1 capsule (500 mg total) by mouth every 12 (twelve) hours for 3 days.   diphenoxylate-atropine 2.5-0.025 MG tablet Commonly known as: LOMOTIL Take 1 tablet by mouth 3 (three) times daily before meals for 7 days.   levETIRAcetam 250 MG tablet Commonly known as: KEPPRA Take 250 mg by mouth 2 (two) times daily.   lovastatin 40  MG tablet Commonly known as: MEVACOR Take 40 mg by mouth at bedtime.   oxybutynin 5 MG tablet Commonly known as: DITROPAN Take 5 mg by mouth 3 (three) times daily.   pantoprazole 40 MG tablet Commonly known as: PROTONIX Take 40 mg by mouth daily.   potassium chloride SA 20 MEQ tablet Commonly known as: KLOR-CON M Take 1 tablet (20 mEq total) by mouth daily. What changed: when to take this   sertraline 100 MG tablet Commonly  known as: ZOLOFT Take 150 mg by mouth daily.   traZODone 50 MG tablet Commonly known as: DESYREL Take 25 mg by mouth at bedtime.         The results of significant diagnostics from this hospitalization (including imaging, microbiology, ancillary and laboratory) are listed below for reference.    Procedures and Diagnostic Studies:   CT Head Wo Contrast  Result Date: 09/28/2021 CLINICAL DATA:  Altered mental status EXAM: CT HEAD WITHOUT CONTRAST TECHNIQUE: Contiguous axial images were obtained from the base of the skull through the vertex without intravenous contrast. RADIATION DOSE REDUCTION: This exam was performed according to the departmental dose-optimization program which includes automated exposure control, adjustment of the mA and/or kV according to patient size and/or use of iterative reconstruction technique. COMPARISON:  None Available. FINDINGS: Brain: There is no mass, hemorrhage or extra-axial collection. There is generalized atrophy without lobar predilection. Hypodensity of the white matter is most commonly associated with chronic microvascular disease. Old right basal ganglia small vessel infarct. Vascular: Atherosclerotic calcification of the internal carotid arteries at the skull base. No abnormal hyperdensity of the major intracranial arteries or dural venous sinuses. Skull: The visualized skull base, calvarium and extracranial soft tissues are normal. Sinuses/Orbits: No fluid levels or advanced mucosal thickening of the visualized paranasal  sinuses. No mastoid or middle ear effusion. The orbits are normal. IMPRESSION: 1. No acute intracranial abnormality. 2. Chronic microvascular ischemia and generalized atrophy. Electronically Signed   By: Ulyses Jarred M.D.   On: 09/28/2021 02:15   CT ABDOMEN PELVIS W CONTRAST  Result Date: 09/28/2021 CLINICAL DATA:  Nausea and vomiting EXAM: CT ABDOMEN AND PELVIS WITH CONTRAST TECHNIQUE: Multidetector CT imaging of the abdomen and pelvis was performed using the standard protocol following bolus administration of intravenous contrast. RADIATION DOSE REDUCTION: This exam was performed according to the departmental dose-optimization program which includes automated exposure control, adjustment of the mA and/or kV according to patient size and/or use of iterative reconstruction technique. CONTRAST:  32m OMNIPAQUE IOHEXOL 300 MG/ML  SOLN COMPARISON:  None Available. FINDINGS: Lower Chest: Normal. Hepatobiliary: Normal hepatic contours. Mild biliary dilatation with common bile duct measuring 10 mm. There is mild intrahepatic dilatation as well. There is cholelithiasis without acute inflammation. Pancreas: Pancreas appears normal. Pancreatic duct is at upper limits of normal. Spleen: Normal. Adrenals/Urinary Tract: The adrenal glands are normal. No hydronephrosis, nephroureterolithiasis or solid renal mass. Left interpolar renal cyst measures 7.2 cm. This is Bosniak class 1 and no follow-up imaging is recommended. The urinary bladder is normal for degree of distention Stomach/Bowel: There is no hiatal hernia. Normal duodenal course and caliber. No small bowel dilatation or inflammation. No focal colonic abnormality. Normal appendix. Vascular/Lymphatic: There is calcific atherosclerosis of the abdominal aorta. No lymphadenopathy. Reproductive: Status post hysterectomy. No adnexal mass. Other: Status post hernia repair. Musculoskeletal: Bilateral hip arthroplasties. Chronic compression fracture of L3 with pars  interarticularis defects and grade 1 anterolisthesis. IMPRESSION: 1. No acute abnormality of the abdomen or pelvis. 2. Mild intrahepatic and extrahepatic biliary dilatation. 3. Chronic compression fracture of L3 with pars interarticularis defects and grade 1 anterolisthesis. 4. Aortic Atherosclerosis (ICD10-I70.0). Electronically Signed   By: KUlyses JarredM.D.   On: 09/28/2021 02:08   DG Chest 1 View  Result Date: 09/28/2021 CLINICAL DATA:  Altered mental status.  Nausea and vomiting. EXAM: CHEST  1 VIEW COMPARISON:  Remote radiograph 11/06/2009 FINDINGS: The cardiomediastinal contours are normal. Diffuse thoracic aortic atherosclerosis. Mild bibasilar atelectasis. Pulmonary vasculature is normal. No  consolidation, pleural effusion, or pneumothorax. No acute osseous abnormalities are seen. Probable left calcified breast lesion IMPRESSION: 1. Mild bibasilar atelectasis. 2.  Aortic Atherosclerosis (ICD10-I70.0). Electronically Signed   By: Keith Rake M.D.   On: 09/28/2021 00:29     Labs:   Basic Metabolic Panel: Recent Labs  Lab 09/29/21 0543 09/30/21 0250 10/01/21 0304 10/02/21 0320 10/03/21 0342  NA 143 142 145 145 144  K 3.1* 3.3* 3.2* 3.5 3.5  CL 113* 115* 120* 117* 119*  CO2 21* 17* 19* 20* 19*  GLUCOSE 92 88 103* 82 81  BUN '10 9 8 '$ <5* <5*  CREATININE 1.01* 0.89 0.67 0.69 0.61  CALCIUM 8.1* 8.2* 7.6* 8.0* 7.9*  MG 1.5* 2.2 1.8 1.6* 2.2  PHOS  --   --  2.2* 3.3  --    GFR Estimated Creatinine Clearance: 33.8 mL/min (by C-G formula based on SCr of 0.61 mg/dL). Liver Function Tests: Recent Labs  Lab 09/27/21 2323  AST 22  ALT 13  ALKPHOS 107  BILITOT 0.3  PROT 6.9  ALBUMIN 3.9   Recent Labs  Lab 09/27/21 2323  LIPASE 29   No results for input(s): "AMMONIA" in the last 168 hours. Coagulation profile No results for input(s): "INR", "PROTIME" in the last 168 hours.  CBC: Recent Labs  Lab 09/29/21 0322 09/30/21 0250 10/01/21 0304 10/02/21 0320 10/03/21 0342   WBC 5.0 3.9* 3.7* 4.0 3.3*  NEUTROABS 3.8 2.2 2.3 2.4 1.7  HGB 11.0* 11.4* 10.2* 10.7* 10.1*  HCT 34.5* 37.9 32.1* 34.1* 33.0*  MCV 101.2* 105.0* 103.2* 101.2* 104.1*  PLT 161 155 136* 146* 127*   Cardiac Enzymes: No results for input(s): "CKTOTAL", "CKMB", "CKMBINDEX", "TROPONINI" in the last 168 hours. BNP: Invalid input(s): "POCBNP" CBG: No results for input(s): "GLUCAP" in the last 168 hours. D-Dimer No results for input(s): "DDIMER" in the last 72 hours. Hgb A1c No results for input(s): "HGBA1C" in the last 72 hours. Lipid Profile No results for input(s): "CHOL", "HDL", "LDLCALC", "TRIG", "CHOLHDL", "LDLDIRECT" in the last 72 hours. Thyroid function studies No results for input(s): "TSH", "T4TOTAL", "T3FREE", "THYROIDAB" in the last 72 hours.  Invalid input(s): "FREET3" Anemia work up No results for input(s): "VITAMINB12", "FOLATE", "FERRITIN", "TIBC", "IRON", "RETICCTPCT" in the last 72 hours. Microbiology Recent Results (from the past 240 hour(s))  Gastrointestinal Panel by PCR , Stool     Status: None   Collection Time: 09/27/21  5:01 PM   Specimen: STOOL  Result Value Ref Range Status   Campylobacter species NOT DETECTED NOT DETECTED Final   Plesimonas shigelloides NOT DETECTED NOT DETECTED Final   Salmonella species NOT DETECTED NOT DETECTED Final   Yersinia enterocolitica NOT DETECTED NOT DETECTED Final   Vibrio species NOT DETECTED NOT DETECTED Final   Vibrio cholerae NOT DETECTED NOT DETECTED Final   Enteroaggregative E coli (EAEC) NOT DETECTED NOT DETECTED Final   Enteropathogenic E coli (EPEC) NOT DETECTED NOT DETECTED Final   Enterotoxigenic E coli (ETEC) NOT DETECTED NOT DETECTED Final   Shiga like toxin producing E coli (STEC) NOT DETECTED NOT DETECTED Final   Shigella/Enteroinvasive E coli (EIEC) NOT DETECTED NOT DETECTED Final   Cryptosporidium NOT DETECTED NOT DETECTED Final   Cyclospora cayetanensis NOT DETECTED NOT DETECTED Final   Entamoeba  histolytica NOT DETECTED NOT DETECTED Final   Giardia lamblia NOT DETECTED NOT DETECTED Final   Adenovirus F40/41 NOT DETECTED NOT DETECTED Final   Astrovirus NOT DETECTED NOT DETECTED Final   Norovirus GI/GII NOT DETECTED NOT  DETECTED Final   Rotavirus A NOT DETECTED NOT DETECTED Final   Sapovirus (I, II, IV, and V) NOT DETECTED NOT DETECTED Final    Comment: Performed at [facility], [place], [place] [address]  C Difficile Quick Screen w PCR reflex     Status: None   Collection Time: 09/27/21  5:01 PM   Specimen: STOOL  Result Value Ref Range Status   C Diff antigen NEGATIVE NEGATIVE Final   C Diff toxin NEGATIVE NEGATIVE Final   C Diff interpretation No C. difficile detected.  Final    Comment: Performed at [facility], [place] [address], [place], [place] [address]  Urine Culture     Status: Abnormal   Collection Time: 09/27/21 11:48 PM   Specimen: Urine, Clean Catch  Result Value Ref Range Status   Specimen Description   Final    URINE, CLEAN CATCH Performed at [facility], [place] [address] [place], [place] [address]    Special Requests   Final    NONE Performed at [facility], [place] [address], [place], [place] [address]    Culture >=100,000 COLONIES/mL PROTEUS MIRABILIS (A)  Final   Report Status 09/30/2021 FINAL  Final   Organism ID, Bacteria PROTEUS MIRABILIS (A)  Final      Susceptibility   Proteus mirabilis - MIC*    AMPICILLIN <=2 SENSITIVE Sensitive     CEFAZOLIN <=4 SENSITIVE Sensitive     CEFEPIME 0.5 SENSITIVE Sensitive     CEFTRIAXONE <=0.25 SENSITIVE Sensitive     CIPROFLOXACIN <=0.25 SENSITIVE Sensitive     GENTAMICIN <=1 SENSITIVE Sensitive     IMIPENEM 2 SENSITIVE Sensitive     NITROFURANTOIN 128 RESISTANT Resistant     TRIMETH/SULFA <=20 SENSITIVE Sensitive     AMPICILLIN/SULBACTAM <=2 SENSITIVE Sensitive     PIP/TAZO <=4 SENSITIVE Sensitive     * >=100,000 COLONIES/mL  PROTEUS MIRABILIS  SARS Coronavirus 2 by RT PCR (hospital order, performed in [place] hospital lab) *cepheid single result test* Anterior Nasal Swab     Status: Abnormal   Collection Time: 10/01/21  3:40 PM   Specimen: Anterior Nasal Swab  Result Value Ref Range Status   SARS Coronavirus 2 by RT PCR POSITIVE (A) NEGATIVE Final    Comment: (NOTE) SARS-CoV-2 target nucleic acids are DETECTED  SARS-CoV-2 RNA is generally detectable in upper respiratory specimens  during the acute phase of infection.  Positive results are indicative  of the presence of the identified virus, but do not rule out bacterial infection or co-infection with other pathogens not detected by the test.  Clinical correlation with patient history and  other diagnostic information is necessary to determine patient infection status.  The expected result is negative.  Fact Sheet for Patients:   https://www.patel.info/   Fact Sheet for Healthcare Providers:   https://hall.com/    This test is not yet approved or cleared by the [place] FDA and  has been authorized for detection and/or diagnosis of SARS-CoV-2 by FDA under an Emergency Use Authorization (EUA).  This EUA will remain in effect (meaning this test can be used) for the duration of  the COVID-19 declaration under Section 564(b)(1)  of the Act, 21 U.S.C. section 360-bbb-3(b)(1), unless the authorization is terminated or revoked sooner.   Performed at [facility], [place] [address], [place], [place] [address]     Time coordinating discharge: 35 minutes  Signed: Marlowe Aschoff Ryen Rhames  [facility] 10/04/2021,  1:42 PM

## 2021-10-04 NOTE — Progress Notes (Signed)
Physical Therapy Treatment Patient Details Name: Michele Harper MRN: [id number] DOB: 05-30-1935 Today's Date: 10/04/2021   History of Present Illness Pt is an 86 year old female presenting to ED with diarrhea.  Pt admitted 09/27/21 for Acute cystitis without hematuria and dehydration. PMHx: COPD, PVD, breast cancer    PT Comments    General Comments: AxO x 3 very pleasant and knowledgable and aware.  She stated her care taker and Daughter got COVID.  Daughter was hospitalized.  Assisted OOB to [facility] required increased time and effort.General transfer comment: assisted from bed to [facility] and back to bed due to c/o weakness/fatigue.  Pt did feel strong enough to attempt amb to bathroom.  Pt plans to D/C to home when Daughter is D/C from hospital.    Recommendations for follow up therapy are one component of a multi-disciplinary discharge planning process, led by the attending physician.  Recommendations may be updated based on patient status, additional functional criteria and insurance authorization.  Follow Up Recommendations  Home health PT     Assistance Recommended at Discharge Intermittent Supervision/Assistance  Patient can return home with the following A little help with walking and/or transfers;A little help with bathing/dressing/bathroom;Assist for transportation;Help with stairs or ramp for entrance;Assistance with cooking/housework   Equipment Recommendations  None recommended by PT    Recommendations for Other Services       Precautions / Restrictions Precautions Precautions: Fall Precaution Comments: 2 lts home O2 Restrictions Weight Bearing Restrictions: No     Mobility  Bed Mobility Overal bed mobility: Needs Assistance Bed Mobility: Supine to Sit, Sit to Supine     Supine to sit: Supervision Sit to supine: Supervision   General bed mobility comments: pt self able with increased time.    Transfers Overall transfer level: Needs assistance Equipment used:  Rolling walker (2 wheels) Transfers: Bed to chair/wheelchair/BSC   Stand pivot transfers: Supervision, Min guard         General transfer comment: assisted from bed to [facility] and back to bed due to c/o weakness/fatigue.  Pt did feel strong enough to attempt amb to bathroom.    Ambulation/Gait               General Gait Details: transfer only due to MAX c/o weakness/fatigue.   Stairs             Wheelchair Mobility    Modified Rankin (Stroke Patients Only)       Balance                                            Cognition Arousal/Alertness: Awake/alert Behavior During Therapy: WFL for tasks assessed/performed Overall Cognitive Status: Within Functional Limits for tasks assessed                                 General Comments: AxO x 3 very pleasant and knowledgable and aware.  She stated her care taker and Daughter got COVID.  Daughter was hospitalized.        Exercises      General Comments        Pertinent Vitals/Pain Pain Assessment Pain Assessment: No/denies pain    Home Living  Prior Function            PT Goals (current goals can now be found in the care plan section) Progress towards PT goals: Progressing toward goals    Frequency    Min 3X/week      PT Plan Current plan remains appropriate    Co-evaluation              AM-PAC PT "6 Clicks" Mobility   Outcome Measure  Help needed turning from your back to your side while in a flat bed without using bedrails?: A Little Help needed moving from lying on your back to sitting on the side of a flat bed without using bedrails?: A Little Help needed moving to and from a bed to a chair (including a wheelchair)?: A Little Help needed standing up from a chair using your arms (e.g., wheelchair or bedside chair)?: A Little Help needed to walk in hospital room?: A Little Help needed climbing 3-5 steps with a  railing? : A Little 6 Click Score: 18    End of Session Equipment Utilized During Treatment: Gait belt;Oxygen Activity Tolerance: Patient limited by fatigue Patient left: in bed;with bed alarm set;with call bell/phone within reach Nurse Communication: Mobility status PT Visit Diagnosis: Difficulty in walking, not elsewhere classified (R26.2);Muscle weakness (generalized) (M62.81)     Time: 8616-8372 PT Time Calculation (min) (ACUTE ONLY): 11 min  Charges:  $Therapeutic Activity: 8-22 mins                    Rica Koyanagi  PTA Acute  Rehabilitation Services Office M-F          [phone] Weekend pager [phone]

## 2021-10-04 NOTE — Plan of Care (Signed)
  Problem: Coping: Goal: Level of anxiety will decrease Outcome: Progressing   Problem: Pain Managment: Goal: General experience of comfort will improve Outcome: Progressing   

## 2021-10-10 ENCOUNTER — Other Ambulatory Visit: Payer: Self-pay

## 2021-10-10 ENCOUNTER — Emergency Department (HOSPITAL_COMMUNITY): Payer: Medicare HMO

## 2021-10-10 ENCOUNTER — Emergency Department (HOSPITAL_COMMUNITY)
Admission: EM | Admit: 2021-10-10 | Discharge: 2021-10-10 | Disposition: A | Discharge status: Home / Self Care | Payer: Medicare HMO | Attending: Emergency Medicine | Admitting: Emergency Medicine

## 2021-10-10 ENCOUNTER — Encounter (HOSPITAL_COMMUNITY): Payer: Self-pay | Admitting: *Deleted

## 2021-10-10 DIAGNOSIS — R0981 Nasal congestion: Secondary | ICD-10-CM | POA: Insufficient documentation

## 2021-10-10 DIAGNOSIS — Z20822 Contact with and (suspected) exposure to covid-19: Secondary | ICD-10-CM | POA: Insufficient documentation

## 2021-10-10 DIAGNOSIS — K529 Noninfective gastroenteritis and colitis, unspecified: Secondary | ICD-10-CM | POA: Diagnosis present

## 2021-10-10 DIAGNOSIS — R197 Diarrhea, unspecified: Secondary | ICD-10-CM | POA: Diagnosis not present

## 2021-10-10 DIAGNOSIS — R531 Weakness: Secondary | ICD-10-CM

## 2021-10-10 DIAGNOSIS — R059 Cough, unspecified: Secondary | ICD-10-CM | POA: Insufficient documentation

## 2021-10-10 LAB — C DIFFICILE QUICK SCREEN W PCR REFLEX
C Diff antigen: NEGATIVE
C Diff interpretation: NOT DETECTED
C Diff toxin: NEGATIVE

## 2021-10-10 LAB — CBC WITH DIFFERENTIAL/PLATELET
Abs Immature Granulocytes: 0.06 10*3/uL (ref 0.00–0.07)
Basophils Absolute: 0 10*3/uL (ref 0.0–0.1)
Basophils Relative: 0 %
Eosinophils Absolute: 0.1 10*3/uL (ref 0.0–0.5)
Eosinophils Relative: 1 %
HCT: 40.1 % (ref 36.0–46.0)
Hemoglobin: 12.3 g/dL (ref 12.0–15.0)
Immature Granulocytes: 1 %
Lymphocytes Relative: 8 %
Lymphs Abs: 0.7 10*3/uL (ref 0.7–4.0)
MCH: 31.9 pg (ref 26.0–34.0)
MCHC: 30.7 g/dL (ref 30.0–36.0)
MCV: 104.2 fL — ABNORMAL HIGH (ref 80.0–100.0)
Monocytes Absolute: 0.5 10*3/uL (ref 0.1–1.0)
Monocytes Relative: 6 %
Neutro Abs: 7.2 10*3/uL (ref 1.7–7.7)
Neutrophils Relative %: 84 %
Platelets: 250 10*3/uL (ref 150–400)
RBC: 3.85 MIL/uL — ABNORMAL LOW (ref 3.87–5.11)
RDW: 15.9 % — ABNORMAL HIGH (ref 11.5–15.5)
WBC: 8.5 10*3/uL (ref 4.0–10.5)
nRBC: 0 % (ref 0.0–0.2)

## 2021-10-10 LAB — COMPREHENSIVE METABOLIC PANEL
ALT: 15 U/L (ref 0–44)
AST: 21 U/L (ref 15–41)
Albumin: 3.3 g/dL — ABNORMAL LOW (ref 3.5–5.0)
Alkaline Phosphatase: 80 U/L (ref 38–126)
Anion gap: 8 (ref 5–15)
BUN: 11 mg/dL (ref 8–23)
CO2: 19 mmol/L — ABNORMAL LOW (ref 22–32)
Calcium: 9.2 mg/dL (ref 8.9–10.3)
Chloride: 114 mmol/L — ABNORMAL HIGH (ref 98–111)
Creatinine, Ser: 0.83 mg/dL (ref 0.44–1.00)
GFR, Estimated: >60 mL/min (ref >60)
Glucose, Bld: 104 mg/dL — ABNORMAL HIGH (ref 70–99)
Potassium: 4.2 mmol/L (ref 3.5–5.1)
Sodium: 141 mmol/L (ref 135–145)
Total Bilirubin: 0.5 mg/dL (ref 0.3–1.2)
Total Protein: 6.1 g/dL — ABNORMAL LOW (ref 6.5–8.1)

## 2021-10-10 LAB — URINALYSIS, ROUTINE W REFLEX MICROSCOPIC
Bilirubin Urine: NEGATIVE
Glucose, UA: NEGATIVE mg/dL
Hgb urine dipstick: NEGATIVE
Ketones, ur: NEGATIVE mg/dL
Leukocytes,Ua: NEGATIVE
Nitrite: NEGATIVE
Protein, ur: NEGATIVE mg/dL
Specific Gravity, Urine: 1.013 (ref 1.005–1.030)
pH: 5 (ref 5.0–8.0)

## 2021-10-10 LAB — SARS CORONAVIRUS 2 BY RT PCR: SARS Coronavirus 2 by RT PCR: POSITIVE — AB

## 2021-10-10 LAB — PROTIME-INR
INR: 1.1 (ref 0.8–1.2)
Prothrombin Time: 13.6 seconds (ref 11.4–15.2)

## 2021-10-10 LAB — LACTIC ACID, PLASMA: Lactic Acid, Venous: 1.6 mmol/L (ref 0.5–1.9)

## 2021-10-10 NOTE — ED Notes (Signed)
Unable to obtain two sets of blood cultures. Only able to obtain one blue bottle for first set of blood cultures. This RN stuck the patient twice and Merle, RN also stuck the patient twice for blood cultures. Dr. Zenia Resides aware.

## 2021-10-10 NOTE — ED Provider Notes (Signed)
[place] DEPT Provider Note   CSN: [id number] Arrival date & time: 10/10/21  1418     History  Chief Complaint  Patient presents with   Cough   Diarrhea    Michele Harper is a 86 y.o. female.  86 year old female presents with several days of cough and congestion along with 5 weeks of diarrhea.  Recently admitted for pneumonia and was on antibiotics.  Has been more dyspneic with subjective fevers.  No vomiting appreciated.  No abdominal pain noted.  Daughter felt patient was more confused today.  CBG was 132.  Patient sent in for evaluation       Home Medications Prior to Admission medications   Medication Sig Start Date End Date Taking? Authorizing Provider  acetaminophen (TYLENOL) 500 MG tablet Take 500 mg by mouth 2 (two) times daily.    [provider]  ARIPiprazole (ABILIFY) 5 MG tablet Take 5 mg by mouth daily. 09/15/21   [provider]  buPROPion (WELLBUTRIN XL) 150 MG 24 hr tablet Take 150 mg by mouth every morning. 09/15/21   [provider]  diphenoxylate-atropine (LOMOTIL) 2.5-0.025 MG tablet Take 1 tablet by mouth 3 (three) times daily before meals for 7 days. 10/04/21 10/11/21  Terrilee Croak, MD  levETIRAcetam (KEPPRA) 250 MG tablet Take 250 mg by mouth 2 (two) times daily. 09/15/21   [provider]  lovastatin (MEVACOR) 40 MG tablet Take 40 mg by mouth at bedtime. 09/15/21   [provider]  oxybutynin (DITROPAN) 5 MG tablet Take 5 mg by mouth 3 (three) times daily. 09/15/21   [provider]  pantoprazole (PROTONIX) 40 MG tablet Take 40 mg by mouth daily. 09/15/21   [provider]  potassium chloride SA (KLOR-CON M) 20 MEQ tablet Take 1 tablet (20 mEq total) by mouth daily. 10/04/21   Terrilee Croak, MD  sertraline (ZOLOFT) 100 MG tablet Take 150 mg by mouth daily. 09/15/21   [provider]  traZODone (DESYREL) 50 MG tablet Take 25 mg by mouth at bedtime. 09/15/21    [provider]      Allergies    Ertapenem, Penicillins, Procaine, and Alendronate    Review of Systems   Review of Systems  All other systems reviewed and are negative.   Physical Exam Updated Vital Signs BP (!) 142/83 (BP Location: Right Arm)   Pulse (!) 103   Temp 98.7 F (37.1 C) (Oral)   Resp (!) 22   Ht 1.499 m ('4\' 11"'$ )   Wt 46.7 kg   LMP  (LMP Unknown)   SpO2 92%   BMI 20.80 kg/m  Physical Exam Vitals and nursing note reviewed.  Constitutional:      General: She is not in acute distress.    Appearance: Normal appearance. She is well-developed. She is not toxic-appearing.  HENT:     Head: Normocephalic and atraumatic.  Eyes:     General: Lids are normal.     Conjunctiva/sclera: Conjunctivae normal.     Pupils: Pupils are equal, round, and reactive to light.  Neck:     Thyroid: No thyroid mass.     Trachea: No tracheal deviation.  Cardiovascular:     Rate and Rhythm: Normal rate and regular rhythm.     Heart sounds: Normal heart sounds. No murmur heard.    No gallop.  Pulmonary:     Effort: Pulmonary effort is normal. No respiratory distress.     Breath sounds: No stridor. Examination of the  right-upper field reveals rhonchi. Examination of the left-upper field reveals rhonchi. Rhonchi present. No decreased breath sounds, wheezing or rales.  Abdominal:     General: There is no distension.     Palpations: Abdomen is soft.     Tenderness: There is no abdominal tenderness. There is no rebound.  Musculoskeletal:        General: No tenderness. Normal range of motion.     Cervical back: Normal range of motion and neck supple.  Skin:    General: Skin is warm and dry.     Findings: No abrasion or rash.  Neurological:     General: No focal deficit present.     Mental Status: She is alert and oriented to person, place, and time. Mental status is at baseline.     GCS: GCS eye subscore is 4. GCS verbal subscore is 5. GCS motor subscore is 6.     Cranial  Nerves: No cranial nerve deficit.     Sensory: No sensory deficit.     Motor: Motor function is intact.  Psychiatric:        Attention and Perception: Attention normal.        Speech: Speech normal.        Behavior: Behavior normal.     ED Results / Procedures / Treatments   Labs (all labs ordered are listed, but only abnormal results are displayed) Labs Reviewed  CULTURE, BLOOD (ROUTINE X 2)  CULTURE, BLOOD (ROUTINE X 2)  SARS CORONAVIRUS 2 BY RT PCR  C DIFFICILE QUICK SCREEN W PCR REFLEX    GASTROINTESTINAL PANEL BY PCR, STOOL (REPLACES STOOL CULTURE)  COMPREHENSIVE METABOLIC PANEL  LACTIC ACID, PLASMA  LACTIC ACID, PLASMA  CBC WITH DIFFERENTIAL/PLATELET  PROTIME-INR  URINALYSIS, ROUTINE W REFLEX MICROSCOPIC    EKG None  Radiology DG Chest 2 View  Result Date: 10/10/2021 CLINICAL DATA:  Admitted last week for pneumonia and diarrhea. Patient sent home after improvement. Patient now has gotten worse. EXAM: CHEST - 2 VIEW COMPARISON:  09/28/2021. FINDINGS: Cardiac silhouette is normal in size and configuration. No mediastinal or hilar masses. No evidence of adenopathy. Mild linear opacity at the right lung base consistent with scarring or atelectasis. Trace right pleural effusion. Mild apical pleuroparenchymal scarring at the right apex, stable. Remainder of the lungs is clear. No pneumothorax. Skeletal structures are demineralized. Moderate compression fracture of a midthoracic vertebra, unclear chronicity. IMPRESSION: 1. No acute cardiopulmonary disease. 2. Moderate compression fracture of a midthoracic vertebra, likely chronic but of unclear chronicity. Electronically Signed   By: Lajean Manes M.D.   On: 10/10/2021 15:09    Procedures Procedures    Medications Ordered in ED Medications - No data to display  ED Course/ Medical Decision Making/ A&P                           Medical Decision Making Amount and/or Complexity of Data Reviewed Labs: ordered. Radiology:  ordered.   Patient presented with increased weakness and diarrhea in the setting of COVID.  Given IV fluids here.  Chest x-ray per my interpretation showed no acute infiltrates. She has no leukocytosis on her CBC.  Lactate is normal.  Concern for possible intra-abdominal process and abdominal CT did not show any evidence of acute infection.  Discussion with patient's family states that she has not been eating or drinking very well.  Does appear to be slightly dehydrated.  Will admit to the hospital service  Final Clinical Impression(s) / ED Diagnoses Final diagnoses:  None    Rx / DC Orders ED Discharge Orders     None         Lacretia Leigh, MD 10/10/21 1958

## 2021-10-10 NOTE — ED Notes (Signed)
Patient in Xray

## 2021-10-10 NOTE — ED Notes (Signed)
I provided reinforced discharge education based off of after visit summary/care provided. Pt acknowledged and understood my education. Pt had no further questions/concerns for provider/myself. After visit summary provided to pt. 

## 2021-10-10 NOTE — Consult Note (Signed)
History and Physical    Michele Harper WVP:[id number] DOB: 11-20-1935 DOA: 10/10/2021  PCP: Michele Salinas, MD  Patient coming from: Home  I have personally briefly reviewed patient's old medical records in [place]  Chief Complaint: Diarrhea  HPI: Michele Harper is a 86 y.o. female with medical history significant for COPD, chronic respiratory failure with hypoxia on 2 L O2 via [place], seizure disorder, PAD, chronic diarrhea who presented to the ED for evaluation of generalized weakness.  Patient recently admitted 09/27/2021-10/04/2021.  Initially presented with acute metabolic encephalopathy due to Proteus UTI, treated with IV ceftriaxone followed by oral Keflex.  She was also seen by GI for evaluation of persistent diarrhea.  She underwent colonoscopy on 09/30/2021 which showed multiple diverticuli.  Diarrhea was improving on Lomotil.  She was prepared for discharge on 8/11 however family at home tested positive for COVID-19.  Patient was tested and also resulted positive.  She was placed in isolation and remained stable from a COVID standpoint and ultimately discharged home under care of daughter on 8/14.  Patient currently lives at home with daughter and also has a caretaker.  At baseline she normally ambulates with the use of a rolling walker as well as personal assistance.  She has been having persistent diarrhea for last 5 weeks.  This occurs with ingestion of any types of foods or liquids.  Has not had any significant improvement with Imodium or Kaopectate.  She denies any abdominal pain, nausea, vomiting, chest pain.  She has chronic shortness of breath and cough related to her COPD, not significantly worse compared to her baseline.  She is not having any fevers chills.  I discussed with patient and her daughter at bedside that work-up so far in the emergency department including labs and imaging are reassuring.  Although she has continued diarrhea there is no significant acute  care intervention to be provided from an in-hospital standpoint.  She does not currently have a gastroenterologist but did have colonoscopy performed by Dr. Benson Harper earlier this month when admitted.  They are requesting a referral to GI.  At this point, patient was to be discharged to home.  Daughter is okay taking her home but understandably frustrated regarding her persistent diarrhea.  I discussed with EDP, Dr. Zenia Resides, who will place discharge orders and referral to Dr. Benson Harper.  ED Course  Labs/Imaging I have personally reviewed following labs and imaging studies.  Initial vitals showed BP 142/83, pulse 102, RR 22, temp 98.7 F, SPO2 92% on 2 L O2 via [place].  Labs show WBC 8.5, hemoglobin 12.3, platelets 250,000, sodium 141, potassium 4.2, bicarb 19, BUN 11, creatinine 0.83, serum glucose 104, LFTs within normal limits, lactic acid 1.6.  SARS-CoV-2 PCR remains positive.  C. difficile negative.  GI path panel in process.  Blood culture in process.  2 view chest x-ray negative for focal consolidation, edema, effusion.  Moderate compression fracture of a mid thoracic vertebra seen, unclear chronicity.  CT abdomen/pelvis without contrast shows cholelithiasis without acute cholecystitis.  Unchanged mild dilatation of the CBD measuring up to 0.9 cm seen without calculus or other obstruction.  Sigmoid diverticulosis without acute diverticulitis.  The hospitalist service was consulted to admit for further evaluation and management.  Review of Systems: All systems reviewed and are negative except as documented in history of present illness above.   Past Medical History:  Diagnosis Date   Breast cancer ([place])    COPD (chronic obstructive pulmonary disease) ([place])  2L but noncompliant   Peripheral vascular disease [facility])     Past Surgical History:  Procedure Laterality Date   APPENDECTOMY     BIOPSY  09/30/2021   Procedure: BIOPSY;  Surgeon: Carol Ada, MD;  Location: [facility] ENDOSCOPY;  Service:  Gastroenterology;;   COLONOSCOPY WITH PROPOFOL N/A 09/30/2021   Procedure: COLONOSCOPY WITH PROPOFOL;  Surgeon: Carol Ada, MD;  Location: [facility] ENDOSCOPY;  Service: Gastroenterology;  Laterality: N/A;  Severe uncontrollable diarrhea    Social History:  reports that she has quit smoking. Her smoking use included cigarettes. She has never used smokeless tobacco. She reports that she does not currently use alcohol. She reports that she does not use drugs.  Allergies  Allergen Reactions   Ertapenem Other (See Comments)    Other reaction(s): Confusion She started getting confused on week 3 of treatment, resolved off the medication.     Penicillins Itching and Rash    But can take amoxicillin.      Procaine Rash    Childhood allergy    Alendronate Rash and Other (See Comments)    osteomylititis      History reviewed. No pertinent family history.   Prior to Admission medications   Medication Sig Start Date End Date Taking? Authorizing Provider  acetaminophen (TYLENOL) 500 MG tablet Take 1,000 mg by mouth 2 (two) times daily.   Yes [provider]  albuterol (VENTOLIN HFA) 108 (90 Base) MCG/ACT inhaler Inhale 2 puffs into the lungs every 6 (six) hours as needed for wheezing or shortness of breath.   Yes [provider]  ARIPiprazole (ABILIFY) 5 MG tablet Take 5 mg by mouth daily. 09/15/21  Yes [provider]  buPROPion (WELLBUTRIN XL) 150 MG 24 hr tablet Take 150 mg by mouth every morning. 09/15/21  Yes [provider]  diphenoxylate-atropine (LOMOTIL) 2.5-0.025 MG tablet Take 1 tablet by mouth 3 (three) times daily before meals for 7 days. 10/04/21 10/11/21 Yes Dahal, Marlowe Aschoff, MD  levETIRAcetam (KEPPRA) 250 MG tablet Take 250 mg by mouth 2 (two) times daily. 09/15/21  Yes [provider]  loperamide (IMODIUM A-D) 2 MG tablet Take 2 mg by mouth 4 (four) times daily as needed for diarrhea or loose stools.   Yes [provider]  lovastatin  (MEVACOR) 40 MG tablet Take 40 mg by mouth at bedtime. 09/15/21  Yes [provider]  oxybutynin (DITROPAN) 5 MG tablet Take 5 mg by mouth at bedtime. 09/15/21  Yes [provider]  pantoprazole (PROTONIX) 40 MG tablet Take 40 mg by mouth at bedtime. 09/15/21  Yes [provider]  potassium chloride SA (KLOR-CON M) 20 MEQ tablet Take 1 tablet (20 mEq total) by mouth daily. Patient taking differently: Take 20 mEq by mouth 2 (two) times daily. 10/04/21  Yes Dahal, Marlowe Aschoff, MD  sertraline (ZOLOFT) 100 MG tablet Take 150 mg by mouth daily. 09/15/21  Yes [provider]  traZODone (DESYREL) 50 MG tablet Take 50 mg by mouth at bedtime. 09/15/21  Yes [provider]    Physical Exam: Vitals:   10/10/21 1815 10/10/21 1830 10/10/21 1930 10/10/21 2000  BP: 128/75 117/81 106/77 126/88  Pulse: 94 94 95 96  Resp: (!) '24 20 19 '$ (!) 21  Temp:      TempSrc:      SpO2: 100% 97% 99% 95%  Weight:      Height:       Constitutional: Thin elderly woman resting supine in bed, NAD, calm, comfortable Eyes: EOMI, lids  and conjunctivae normal ENMT: Mucous membranes are dry. Posterior pharynx clear of any exudate or lesions.Normal dentition.  Neck: normal, supple, no masses. Respiratory: Coarse breath sounds throughout. Normal respiratory effort while on 2 L O2 via [place]. No accessory muscle use.  Cardiovascular: Regular rate and rhythm, no murmurs / rubs / gallops.  Abdomen: no tenderness, no masses palpated. No hepatosplenomegaly. Bowel sounds positive.  Musculoskeletal: Thin extremities.  No clubbing / cyanosis. No joint deformity upper and lower extremities. Good ROM, no contractures. Normal muscle tone.  Skin: no rashes, lesions, ulcers. No induration Neurologic: Sensation intact. Strength 5/5 in all 4.  Psychiatric: Alert and oriented x 3. Normal mood.   EKG: Not performed  Assessment/Plan Active Problems:   Chronic diarrhea   Chronic diarrhea: Ongoing issue for  5-6 weeks despite Imodium and Kaopectate use.  Recent colonoscopy while admitted showed multiple diverticuli.  C. difficile is negative.  CT abdomen/pelvis without acute pathology.  Vitals and labs are reassuring.  Recommend outpatient follow-up with GI.  Positive COVID-19 test: Persistent positive SARS-CoV-2 PCR test.  Initially positive on 8/11.  She is afebrile.  CXR without evidence of pneumonia or infiltrate.  COVID infection could be contributing to diarrhea but otherwise stable from infectious standpoint and no indication for COVID-19 specific treatment at this time.   Zada Finders MD [facility]  If 7PM-7AM, please contact night-coverage [email or web address]  10/10/2021, 8:41 PM

## 2021-10-10 NOTE — ED Notes (Signed)
Assumed pt care with RN oversight. Pt A&Ox4, respirations equal & unlabored.  

## 2021-10-10 NOTE — ED Triage Notes (Signed)
Patient BIB EMS and coming from home. Pt was recently last week. She was admitted for pneumonia and diarrhea. Her cough initially improved but now as gotten worse but her diarrhea never improved. EMS reports that she feels warm to the touch. Pt hx COPD and is suppose to wear O2 but is non compliant. Patient is a/o x 4 but daughter states she is more confused today.  BP: 120/70, HR 96, CBG: 132, Cap: 23-24.

## 2021-10-11 LAB — GASTROINTESTINAL PANEL BY PCR, STOOL (REPLACES STOOL CULTURE)

## 2021-10-15 LAB — CULTURE, BLOOD (ROUTINE X 2): Culture: NO GROWTH

## 2021-10-22 DEATH — deceased
